# Patient Record
Sex: Female | Born: 1995 | Race: Black or African American | Hispanic: No | Marital: Single | Smoking: Former smoker
Health system: Southern US, Community
[De-identification: ages and names within clinical notes are randomized; demographics above are authoritative.]

## PROBLEM LIST (undated history)

## (undated) ENCOUNTER — Inpatient Hospital Stay: Payer: Self-pay

## (undated) ENCOUNTER — Inpatient Hospital Stay (HOSPITAL_COMMUNITY): Payer: 59 | Admitting: Family Medicine

## (undated) DIAGNOSIS — Z8759 Personal history of other complications of pregnancy, childbirth and the puerperium: Secondary | ICD-10-CM

## (undated) DIAGNOSIS — J020 Streptococcal pharyngitis: Secondary | ICD-10-CM

## (undated) DIAGNOSIS — J45909 Unspecified asthma, uncomplicated: Secondary | ICD-10-CM

## (undated) DIAGNOSIS — N760 Acute vaginitis: Secondary | ICD-10-CM

## (undated) DIAGNOSIS — B9689 Other specified bacterial agents as the cause of diseases classified elsewhere: Secondary | ICD-10-CM

## (undated) DIAGNOSIS — N390 Urinary tract infection, site not specified: Secondary | ICD-10-CM

## (undated) DIAGNOSIS — F419 Anxiety disorder, unspecified: Secondary | ICD-10-CM

## (undated) HISTORY — DX: Other specified bacterial agents as the cause of diseases classified elsewhere: N76.0

## (undated) HISTORY — DX: Anxiety disorder, unspecified: F41.9

## (undated) HISTORY — DX: Other specified bacterial agents as the cause of diseases classified elsewhere: B96.89

## (undated) HISTORY — DX: Urinary tract infection, site not specified: N39.0

## (undated) HISTORY — DX: Personal history of other complications of pregnancy, childbirth and the puerperium: Z87.59

---
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
## 2017-02-24 ENCOUNTER — Encounter: Payer: Self-pay | Admitting: Obstetrics and Gynecology

## 2017-02-24 ENCOUNTER — Ambulatory Visit (INDEPENDENT_AMBULATORY_CARE_PROVIDER_SITE_OTHER): Payer: 59 | Admitting: Obstetrics and Gynecology

## 2017-02-24 VITALS — BP 120/70 | Ht 61.0 in | Wt 120.0 lb

## 2017-02-24 DIAGNOSIS — N3000 Acute cystitis without hematuria: Secondary | ICD-10-CM

## 2017-02-24 LAB — POCT URINALYSIS DIPSTICK
Bilirubin, UA: NEGATIVE
Blood, UA: POSITIVE
Glucose, UA: NEGATIVE
Ketones, UA: NEGATIVE
NITRITE UA: NEGATIVE
PROTEIN UA: NEGATIVE
SPEC GRAV UA: 1.015 (ref 1.010–1.025)
UROBILINOGEN UA: 1 U/dL
pH, UA: 7 (ref 5.0–8.0)

## 2017-02-24 MED ORDER — CIPROFLOXACIN HCL 500 MG PO TABS: 500.0000 mg | ORAL_TABLET | Freq: Every day | ORAL | 0 refills | Status: AC

## 2017-02-24 MED ORDER — PHENAZOPYRIDINE HCL 200 MG PO TABS: 200.0000 mg | ORAL_TABLET | Freq: Three times a day (TID) | ORAL | 1 refills | Status: AC | PRN

## 2017-02-24 NOTE — Progress Notes (Signed)
Subjective:    Allison Sanchez is a 21 y.o. female who complains of burning with urination for 1 day.  Patient also complains of frequency and the sensation of still needing to void after urination.. Patient denies fever.  Patient does have a history of recurrent UTI.  Patient does not have a history of pyelonephritis. The following portions of the patient's history were reviewed and updated as appropriate: allergies, current medications, past medical history, past social history, past surgical history and problem list. Review of Systems A comprehensive review of systems was negative.    Objective:    BP 120/70   Ht 5\' 1"  (1.549 m)   Wt 120 lb (54.4 kg)   LMP 02/23/2017   BMI 22.67 kg/m  General: alert, cooperative and appears stated age  Abdomen: soft, non-tender, without masses or organomegaly    Back: straight  GU: defer exam   Laboratory:  Urine dipstick shows 1+ for hemoglobin and 2+ for leukocyte esterase.   Micro exam: pending.    Assessment:    Acute cystitis    Plan: Plan:    1. Medications: ciprofloxacin, pyridium for comfort. 2. Maintain adequate hydration 3. Discussed referral to urology if she is having recurrent UTIs. This is the patient's second visit this year for a urinary tract infection. Will send culture today.  4. Follow up if symptoms not improving, and prn. 5. Recommended an annual visit to the patient.

## 2017-02-26 LAB — URINE CULTURE

## 2017-03-14 ENCOUNTER — Encounter: Payer: Self-pay | Admitting: Maternal Newborn

## 2017-03-14 ENCOUNTER — Ambulatory Visit: Payer: 59 | Admitting: Maternal Newborn

## 2017-03-14 VITALS — BP 100/70 | HR 84 | Temp 99.0°F | Ht 61.0 in | Wt 121.0 lb

## 2017-03-14 DIAGNOSIS — R350 Frequency of micturition: Secondary | ICD-10-CM | POA: Diagnosis not present

## 2017-03-14 DIAGNOSIS — N760 Acute vaginitis: Secondary | ICD-10-CM | POA: Diagnosis not present

## 2017-03-14 DIAGNOSIS — R300 Dysuria: Secondary | ICD-10-CM | POA: Diagnosis not present

## 2017-03-14 LAB — POCT URINALYSIS DIPSTICK
Glucose, UA: NEGATIVE
Nitrite, UA: POSITIVE
SPEC GRAV UA: 1.02 (ref 1.010–1.025)
pH, UA: 6 (ref 5.0–8.0)

## 2017-03-14 LAB — POCT WET PREP (WET MOUNT)
Clue Cells Wet Prep Whiff POC: NEGATIVE
TRICHOMONAS WET PREP HPF POC: ABSENT

## 2017-03-14 MED ORDER — PHENAZOPYRIDINE HCL 200 MG PO TABS: 200.0000 mg | ORAL_TABLET | Freq: Three times a day (TID) | ORAL | 1 refills | Status: DC | PRN

## 2017-03-14 MED ORDER — FLUCONAZOLE 150 MG PO TABS
150.0000 mg | ORAL_TABLET | Freq: Every day | ORAL | 0 refills | Status: DC
Start: 2017-03-14 — End: 2017-04-05

## 2017-03-14 MED ORDER — CEPHALEXIN 500 MG PO CAPS
500.0000 mg | ORAL_CAPSULE | Freq: Two times a day (BID) | ORAL | 0 refills | Status: AC
Start: 2017-03-14 — End: 2017-03-21

## 2017-03-14 NOTE — Progress Notes (Signed)
Office Visit: Problem  Chief Complaint  Patient presents with  . Urinary Tract Infection    frequency, painful urination, more lower back pain/lower pelvis, feels like she still needs to go when she finishes, pain mostly on left side of back    Current Issues:  Presents with several days of dysuria and urinary frequency. She also feels as if she has incomplete voiding after she urinates. Associated symptoms include:  flank pain bilaterally and lower abdominal pain. She also noticed some yellow discharge. Afebrile, no chills.  There is a previous history of of similar symptoms. She was treated in July and in early November for UTI. Sexually active:  Yes with female.   No concern for STI.  Prior to Admission medications   Medication Sig Start Date End Date Taking? Authorizing Provider  albuterol (PROVENTIL HFA;VENTOLIN HFA) 108 (90 Base) MCG/ACT inhaler Inhale into the lungs.   Yes [provider]  norethindrone-ethinyl estradiol (JUNEL FE 1/20) 1-20 MG-MCG tablet Take 1 tablet by mouth daily. 07/09/16  Yes Copland, Ilona SorrelAlicia B, PA-C  cephALEXin (KEFLEX) 500 MG capsule Take 1 capsule (500 mg total) by mouth 2 (two) times daily for 7 days. 03/14/17 03/21/17  Oswaldo ConroySchmid, Jacelyn Y, CNM  fluconazole (DIFLUCAN) 150 MG tablet Take 1 tablet (150 mg total) by mouth daily. Take an additional dose if symptoms persist. 03/14/17   Oswaldo ConroySchmid, Jacelyn Y, CNM    Review of Systems: 10 point review of systems negative unless otherwise noted in HPI.  PE:  BP 100/70   Pulse 84   Temp 99 F (37.2 C)   Ht 5\' 1"  (1.549 m)   Wt 121 lb (54.9 kg)   LMP 02/23/2017   BMI 22.86 kg/m  Constitutional: NAD, alert, cooperative Back: CVAT Abdomen: soft, non-tender Pelvic: normal external genitalia, small amount of thick, yellow discharge  UA today shows 2+ leukocytes and positive for nitrites Wet prep: moderate yeast, no clue cells, negative whiff, ph<4, no trichomonads.  Assessment and Plan:    1. Dysuria  and urinary frequency Recently completed Cipro. Treat with Keflex for 7 days and repeat culture. Continue to stay well hydrated. May take Pyridium for pain.  - cephALEXin (KEFLEX) 500 MG capsule; Take 1 capsule (500 mg total) by mouth 2 (two) times daily for 7 days.  Dispense: 14 capsule; Refill: 0 - Urine culture  2. Vaginitis and vulvovaginitis Positive yeast on wet prep. NuSwab sent. Rx for Diflucan.  - fluconazole (DIFLUCAN) 150 MG tablet; Take 1 tablet (150 mg total) by mouth daily. Take an additional dose if symptoms persist.  Dispense: 2 tablet; Refill: 0  Return if symptoms worsen or fail to improve.   Marcelyn BruinsJacelyn Schmid, CNM 03/14/2017  12:00 PM

## 2017-03-16 LAB — URINE CULTURE

## 2017-03-17 LAB — NUSWAB BV AND CANDIDA, NAA
CANDIDA ALBICANS, NAA: POSITIVE — AB
CANDIDA GLABRATA, NAA: NEGATIVE

## 2017-03-18 ENCOUNTER — Other Ambulatory Visit: Payer: Self-pay | Admitting: Maternal Newborn

## 2017-03-18 DIAGNOSIS — R319 Hematuria, unspecified: Principal | ICD-10-CM

## 2017-03-18 DIAGNOSIS — A498 Other bacterial infections of unspecified site: Secondary | ICD-10-CM

## 2017-03-18 DIAGNOSIS — N390 Urinary tract infection, site not specified: Secondary | ICD-10-CM

## 2017-03-18 MED ORDER — SULFAMETHOXAZOLE-TRIMETHOPRIM 800-160 MG PO TABS: 1.0000 | ORAL_TABLET | Freq: Two times a day (BID) | ORAL | 0 refills | Status: AC

## 2017-03-18 NOTE — Progress Notes (Signed)
Left message for patient about change in antibiotics due to urine culture results and sent Rx for Bactrim to pharmacy.  Marcelyn BruinsJacelyn Schmid, CNM 03/18/2017  6:12 PM

## 2017-04-04 ENCOUNTER — Ambulatory Visit: Payer: Self-pay | Admitting: Unknown Physician Specialty

## 2017-04-05 ENCOUNTER — Ambulatory Visit (INDEPENDENT_AMBULATORY_CARE_PROVIDER_SITE_OTHER): Payer: 59 | Admitting: Unknown Physician Specialty

## 2017-04-05 ENCOUNTER — Encounter: Payer: Self-pay | Admitting: Unknown Physician Specialty

## 2017-04-05 VITALS — BP 111/74 | HR 87 | Temp 98.1°F | Ht 61.5 in | Wt 122.0 lb

## 2017-04-05 DIAGNOSIS — R0781 Pleurodynia: Secondary | ICD-10-CM | POA: Diagnosis not present

## 2017-04-05 NOTE — Patient Instructions (Signed)
Lidoderm patches

## 2017-04-05 NOTE — Progress Notes (Signed)
BP 111/74   Pulse 87   Temp 98.1 F (36.7 C) (Oral)   Ht 5' 1.5" (1.562 m)   Wt 122 lb (55.3 kg)   LMP 03/25/2017 (Approximate)   SpO2 98%   BMI 22.68 kg/m    Subjective:    Patient ID: Allison Sanchez, female    DOB: [birth year], 21 y.o.   MRN: [id number]  HPI: Allison Sanchez is a 21 y.o. female  Chief Complaint  Patient presents with  . Establish Care    pt states she has been having pain around left rib area. States she cannot lay on that side and doing certain movements hurts very bad. States she went to urgent care about a month ago for this problem. States the pain comes and goes.    New pt here with complaints of pain left rib area.  This has been going on for about 1 month.  Worse with movement or lying on that side or bending down.  States when the pain is "a 10" she has to try sleeping sitting up.  Pt states the pain will range from 0-10.  Pain waxes and wanes. No SOB.  Had a chest x-ray 1 month ago when she went to urgent care.  States the pain is mostly underneath her breast.    Relevant past medical, surgical, family and social history reviewed and updated as indicated. Interim medical history since our last visit reviewed. Allergies and medications reviewed and updated.  Social History   Socioeconomic History  . Marital status: Single    Spouse name: Not on file  . Number of children: Not on file  . Years of education: Not on file  . Highest education level: Not on file  Social Needs  . Financial resource strain: Not on file  . Food insecurity - worry: Not on file  . Food insecurity - inability: Not on file  . Transportation needs - medical: Not on file  . Transportation needs - non-medical: Not on file  Occupational History  . Not on file  Tobacco Use  . Smoking status: Current Every Day Smoker    Types: Cigarettes  . Smokeless tobacco: Never Used  . Tobacco comment: 1-2 cigarettes daily  Substance and Sexual Activity  . Alcohol use: No  . Drug use:  No  . Sexual activity: Yes    Birth control/protection: Pill  Other Topics Concern  . Not on file  Social History Narrative  . Not on file   Family History  Problem Relation Age of Onset  . Hypertension Mother   . Hypertension Daughter        pulmonary  . Cancer Paternal Uncle   . Gout Maternal Grandfather   . Cancer Paternal Uncle   . Cancer Paternal Uncle    Past Medical History:  Diagnosis Date  . Asthma   . History of twin pregnancy in prior pregnancy   . Premature delivery    Past Surgical History:  Procedure Laterality Date  . CESAREAN SECTION N/A 01/10/2016   Procedure: CESAREAN SECTION;  Surgeon: Catalina AntiguaPeggy Constant, MD;  Location: [facility];  Service: Obstetrics;  Laterality: N/A;     Review of Systems  Constitutional: Negative.   Respiratory: Negative.   Gastrointestinal: Negative.   Genitourinary: Negative.   Psychiatric/Behavioral: Negative.     Per HPI unless specifically indicated above     Objective:    BP 111/74   Pulse 87   Temp 98.1 F (36.7 C) (Oral)  Ht 5' 1.5" (1.562 m)   Wt 122 lb (55.3 kg)   LMP 03/25/2017 (Approximate)   SpO2 98%   BMI 22.68 kg/m   Wt Readings from Last 3 Encounters:  04/05/17 122 lb (55.3 kg) (38 %, Z= -0.30)*  03/14/17 121 lb (54.9 kg) (36 %, Z= -0.35)*  02/24/17 120 lb (54.4 kg) (34 %, Z= -0.40)*   * Growth percentiles are based on CDC (Girls, 2-20 Years) data.    Physical Exam  Constitutional: She is oriented to person, place, and time. She appears well-developed and well-nourished. No distress.  HENT:  Head: Normocephalic and atraumatic.  Eyes: Conjunctivae and lids are normal. Right eye exhibits no discharge. Left eye exhibits no discharge. No scleral icterus.  Neck: Normal range of motion. Neck supple. No JVD present. Carotid bruit is not present.  Cardiovascular: Normal rate, regular rhythm and normal heart sounds.  Pulmonary/Chest: Effort normal and breath sounds normal.  No rib TTP  Abdominal:  Normal appearance. There is no splenomegaly or hepatomegaly.  Musculoskeletal: Normal range of motion.  Neurological: She is alert and oriented to person, place, and time.  Skin: Skin is warm, dry and intact. No rash noted. No pallor.  Psychiatric: She has a normal mood and affect. Her behavior is normal. Judgment and thought content normal.    Results for orders placed or performed in visit on 03/14/17  Urine culture  Result Value Ref Range   Urine Culture, Routine Final report (A)    Organism ID, Bacteria Comment (A)   POCT urinalysis dipstick  Result Value Ref Range   Color, UA     Clarity, UA     Glucose, UA neg    Bilirubin, UA     Ketones, UA     Spec Grav, UA 1.020 1.010 - 1.025   Blood, UA large    pH, UA 6.0 5.0 - 8.0   Protein, UA     Urobilinogen, UA  0.2 or 1.0 E.U./dL   Nitrite, UA positive    Leukocytes, UA Moderate (2+) (A) Negative  POCT Wet Prep ([place]et [place])  Result Value Ref Range   Source Wet Prep POC     WBC, Wet Prep HPF POC     Bacteria Wet Prep HPF POC Few Few   BACTERIA WET PREP MORPHOLOGY POC     Clue Cells Wet Prep HPF POC None None   Clue Cells Wet Prep Whiff POC Negative Whiff    Yeast Wet Prep HPF POC Moderate    KOH Wet Prep POC     Trichomonas Wet Prep HPF POC Absent Absent  NuSwab BV and Candida, NAA  Result Value Ref Range   Atopobium vaginae Low - 0 Score   BVAB 2 Low - 0 Score   Megasphaera 1 Low - 0 Score   Candida albicans, NAA Positive (A) Negative   Candida glabrata, NAA Negative Negative      Assessment & Plan:   Problem List Items Addressed This Visit    None    Visit Diagnoses    Rib pain on left side    -  Primary   Unclear cause.  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  Get chest x-ray and rib films.  Consider bone scan.  Lidoderm patches for relief   Relevant Orders   DG Chest 2 View   DG Ribs Unilateral Left       Follow up plan: Return in about 4 weeks (around 05/03/2017).

## 2017-04-06 ENCOUNTER — Ambulatory Visit
Admission: RE | Admit: 2017-04-06 | Discharge: 2017-04-06 | Disposition: A | Discharge status: Home / Self Care | Payer: 59 | Source: Ambulatory Visit | Attending: Unknown Physician Specialty | Admitting: Unknown Physician Specialty

## 2017-04-06 DIAGNOSIS — R0781 Pleurodynia: Secondary | ICD-10-CM

## 2017-05-03 ENCOUNTER — Ambulatory Visit: Payer: Self-pay | Admitting: Unknown Physician Specialty

## 2017-05-04 ENCOUNTER — Ambulatory Visit: Payer: Managed Care, Other (non HMO) | Admitting: Family Medicine

## 2017-05-04 ENCOUNTER — Encounter: Payer: Self-pay | Admitting: Family Medicine

## 2017-05-04 ENCOUNTER — Ambulatory Visit: Payer: Self-pay | Admitting: Family Medicine

## 2017-05-04 VITALS — BP 116/70 | HR 69 | Temp 98.7°F | Wt 124.0 lb

## 2017-05-04 DIAGNOSIS — J019 Acute sinusitis, unspecified: Secondary | ICD-10-CM

## 2017-05-04 MED ORDER — BENZONATATE 100 MG PO CAPS
100.0000 mg | ORAL_CAPSULE | Freq: Two times a day (BID) | ORAL | 0 refills | Status: DC | PRN
Start: 2017-05-04 — End: 2017-09-28

## 2017-05-04 MED ORDER — AMOXICILLIN-POT CLAVULANATE 875-125 MG PO TABS: 1.0000 | ORAL_TABLET | Freq: Two times a day (BID) | ORAL | 0 refills | Status: DC

## 2017-05-04 NOTE — Progress Notes (Signed)
BP 116/70   Pulse 69   Temp 98.7 F (37.1 C) (Oral)   Wt 124 lb (56.2 kg)   SpO2 98%   BMI 23.05 kg/m    Subjective:    Patient ID: Allison Sanchez, female    DOB: [birth year], 22 y.o.   MRN: [id number]  HPI: Allison Sanchez is a 22 y.o. female  Chief Complaint  Patient presents with  . Cough    x 3 weeks   Patient with cough congestion drainagefacial pressure  Some systemic symptoms of fever and achiness is been ongoing for 3 weeks and now getting worse.Hasn't really tried other medications.  Relevant past medical, surgical, family and social history reviewed and updated as indicated. Interim medical history since our last visit reviewed. Allergies and medications reviewed and updated.  Review of Systems  Constitutional: Positive for chills, diaphoresis, fatigue and fever.  HENT: Positive for congestion, sinus pressure, sinus pain, sneezing and sore throat.   Respiratory: Positive for cough. Negative for wheezing.   Cardiovascular: Negative.  Negative for chest pain.    Per HPI unless specifically indicated above     Objective:    BP 116/70   Pulse 69   Temp 98.7 F (37.1 C) (Oral)   Wt 124 lb (56.2 kg)   SpO2 98%   BMI 23.05 kg/m   Wt Readings from Last 3 Encounters:  05/04/17 124 lb (56.2 kg) (42 %, Z= -0.20)*  04/05/17 122 lb (55.3 kg) (38 %, Z= -0.30)*  03/14/17 121 lb (54.9 kg) (36 %, Z= -0.35)*   * Growth percentiles are based on CDC (Girls, 2-20 Years) data.    Physical Exam  Constitutional: She is oriented to person, place, and time. She appears well-developed and well-nourished.  HENT:  Head: Normocephalic and atraumatic.  Right Ear: External ear normal.  Left Ear: External ear normal.  Mouth/Throat: Oropharyngeal exudate present.  Eyes: Conjunctivae and EOM are normal.  Neck: Normal range of motion.  Cardiovascular: Normal rate, regular rhythm and normal heart sounds.  Pulmonary/Chest: Effort normal and breath sounds normal.    Musculoskeletal: Normal range of motion.  Neurological: She is alert and oriented to person, place, and time.  Skin: No erythema.  Psychiatric: She has a normal mood and affect. Her behavior is normal. Judgment and thought content normal.    Results for orders placed or performed in visit on 03/14/17  Urine culture  Result Value Ref Range   Urine Culture, Routine Final report (A)    Organism ID, Bacteria Comment (A)   POCT urinalysis dipstick  Result Value Ref Range   Color, UA     Clarity, UA     Glucose, UA neg    Bilirubin, UA     Ketones, UA     Spec Grav, UA 1.020 1.010 - 1.025   Blood, UA large    pH, UA 6.0 5.0 - 8.0   Protein, UA     Urobilinogen, UA  0.2 or 1.0 E.U./dL   Nitrite, UA positive    Leukocytes, UA Moderate (2+) (A) Negative  POCT Wet Prep (Wet [place])  Result Value Ref Range   Source Wet Prep POC     WBC, Wet Prep HPF POC     Bacteria Wet Prep HPF POC Few Few   BACTERIA WET PREP MORPHOLOGY POC     Clue Cells Wet Prep HPF POC None None   Clue Cells Wet Prep Whiff POC Negative Whiff    Yeast Wet Prep HPF  POC Moderate    KOH Wet Prep POC     Trichomonas Wet Prep HPF POC Absent Absent  NuSwab BV and Candida, NAA  Result Value Ref Range   Atopobium vaginae Low - 0 Score   BVAB 2 Low - 0 Score   Megasphaera 1 Low - 0 Score   Candida albicans, NAA Positive (A) Negative   Candida glabrata, NAA Negative Negative      Assessment & Plan:   Problem List Items Addressed This Visit    None    Visit Diagnoses    Acute sinusitis, recurrence not specified, unspecified location    -  Primary   Relevant Medications   amoxicillin-clavulanate (AUGMENTIN) 875-125 MG tablet   benzonatate (TESSALON) 100 MG capsule     discussed sinusitis care and treatment use of medications use of over-the-counter medications  Follow up plan: Return for As scheduled.

## 2017-05-08 ENCOUNTER — Encounter: Payer: Self-pay | Admitting: Unknown Physician Specialty

## 2017-05-08 ENCOUNTER — Ambulatory Visit (INDEPENDENT_AMBULATORY_CARE_PROVIDER_SITE_OTHER): Payer: Managed Care, Other (non HMO) | Admitting: Unknown Physician Specialty

## 2017-05-08 VITALS — BP 111/70 | HR 74 | Temp 97.9°F | Ht 61.5 in | Wt 122.7 lb

## 2017-05-08 DIAGNOSIS — Z3041 Encounter for surveillance of contraceptive pills: Secondary | ICD-10-CM

## 2017-05-08 DIAGNOSIS — J320 Chronic maxillary sinusitis: Secondary | ICD-10-CM | POA: Diagnosis not present

## 2017-05-08 DIAGNOSIS — Z0000 Encounter for general adult medical examination without abnormal findings: Secondary | ICD-10-CM

## 2017-05-08 DIAGNOSIS — R0789 Other chest pain: Secondary | ICD-10-CM

## 2017-05-08 MED ORDER — ALBUTEROL SULFATE HFA 108 (90 BASE) MCG/ACT IN AERS: 2.0000 | INHALATION_SPRAY | Freq: Four times a day (QID) | RESPIRATORY_TRACT | 2 refills | Status: DC | PRN

## 2017-05-08 MED ORDER — AMOXICILLIN 250 MG/5ML PO SUSR: 500.0000 mg | Freq: Three times a day (TID) | ORAL | 0 refills | Status: DC

## 2017-05-08 MED ORDER — NORETHIN ACE-ETH ESTRAD-FE 1-20 MG-MCG PO TABS: 1.0000 | ORAL_TABLET | Freq: Every day | ORAL | 11 refills | Status: DC

## 2017-05-08 NOTE — Assessment & Plan Note (Signed)
This is occasional but severe when it happens.  X-rays are negative.  Will start NSAIDs to see if it helps with the pain.  May need a thorough pulmonary evaluation if it continues

## 2017-05-08 NOTE — Progress Notes (Signed)
BP 111/70   Pulse 74   Temp 97.9 F (36.6 C) (Oral)   Ht 5' 1.5" (1.562 m)   Wt 122 lb 11.2 oz (55.7 kg)   SpO2 98%   BMI 22.81 kg/m    Subjective:    Patient ID: Allison Sanchez, female    DOB: 1996/04/17, 22 y.o.   MRN: [id number]  HPI: Allison Sanchez is a 22 y.o. female  Chief Complaint  Patient presents with  . Annual Exam   Chest wall pain Pt states her pain comes and goes.  Her x-rays have all been normal.  Left sided pain comes and goes.  Episodes are getting more frequent.  No pain today.  Pt states it happens once every few months for 3-4 days.  States it feels like someone is sitting on her ribs.    Rib films and chest x-ray reviewed and were normal.    Sinusitis  This is a new (rx with Dr Dossie Arbour.  She can't take pills) problem. There has been no fever. Associated symptoms include congestion, coughing, headaches, a hoarse voice and sinus pressure. Pertinent negatives include no chills, diaphoresis, ear pain, neck pain, shortness of breath, sneezing, sore throat or swollen glands. Past treatments include nothing.   Asthma Needs inhaler on occasion.  Needs a refill  Relevant past medical, surgical, family and social history reviewed and updated as indicated. Interim medical history since our last visit reviewed. Allergies and medications reviewed and updated.  Review of Systems  Constitutional: Negative for chills and diaphoresis.  HENT: Positive for congestion, hoarse voice and sinus pressure. Negative for ear pain, sneezing and sore throat.   Respiratory: Positive for cough. Negative for shortness of breath.   Musculoskeletal: Negative for neck pain.  Neurological: Positive for headaches.    Per HPI unless specifically indicated above     Objective:    BP 111/70   Pulse 74   Temp 97.9 F (36.6 C) (Oral)   Ht 5' 1.5" (1.562 m)   Wt 122 lb 11.2 oz (55.7 kg)   SpO2 98%   BMI 22.81 kg/m   Wt Readings from Last 3 Encounters:  05/08/17 122 lb 11.2 oz  (55.7 kg) (39 %, Z= -0.27)*  05/04/17 124 lb (56.2 kg) (42 %, Z= -0.20)*  04/05/17 122 lb (55.3 kg) (38 %, Z= -0.30)*   * Growth percentiles are based on CDC (Girls, 2-20 Years) data.    Physical Exam  Constitutional: She is oriented to person, place, and time. She appears well-developed and well-nourished. No distress.  HENT:  Head: Normocephalic and atraumatic.  Right Ear: Tympanic membrane and ear canal normal.  Left Ear: Tympanic membrane and ear canal normal.  Nose: Rhinorrhea present. Right sinus exhibits no maxillary sinus tenderness and no frontal sinus tenderness. Left sinus exhibits no maxillary sinus tenderness and no frontal sinus tenderness.  Mouth/Throat: Mucous membranes are normal. Posterior oropharyngeal erythema present.  Eyes: Conjunctivae and lids are normal. Pupils are equal, round, and reactive to light. Right eye exhibits no discharge. Left eye exhibits no discharge. No scleral icterus.  Neck: Normal range of motion. Neck supple. Carotid bruit is not present. No thyromegaly present.  Cardiovascular: Normal rate, regular rhythm and normal heart sounds. Exam reveals no gallop and no friction rub.  No murmur heard. Pulmonary/Chest: Effort normal and breath sounds normal. No respiratory distress. She has no wheezes. She has no rales.  Abdominal: Soft. Normal appearance and bowel sounds are normal. There is no splenomegaly or  hepatomegaly. There is no tenderness. There is no rebound.  Genitourinary: No breast swelling, tenderness or discharge.  Musculoskeletal: Normal range of motion.  Lymphadenopathy:    She has no cervical adenopathy.  Neurological: She is alert and oriented to person, place, and time.  Skin: Skin is warm, dry and intact. No rash noted. No pallor.  Psychiatric: She has a normal mood and affect. Her speech is normal and behavior is normal. Judgment and thought content normal. Cognition and memory are normal.    Results for orders placed or performed in  visit on 03/14/17  Urine culture  Result Value Ref Range   Urine Culture, Routine Final report (A)    Organism ID, Bacteria Comment (A)   POCT urinalysis dipstick  Result Value Ref Range   Color, UA     Clarity, UA     Glucose, UA neg    Bilirubin, UA     Ketones, UA     Spec Grav, UA 1.020 1.010 - 1.025   Blood, UA large    pH, UA 6.0 5.0 - 8.0   Protein, UA     Urobilinogen, UA  0.2 or 1.0 E.U./dL   Nitrite, UA positive    Leukocytes, UA Moderate (2+) (A) Negative  POCT Wet Prep (Wet [place])  Result Value Ref Range   Source Wet Prep POC     WBC, Wet Prep HPF POC     Bacteria Wet Prep HPF POC Few Few   BACTERIA WET PREP MORPHOLOGY POC     Clue Cells Wet Prep HPF POC None None   Clue Cells Wet Prep Whiff POC Negative Whiff    Yeast Wet Prep HPF POC Moderate    KOH Wet Prep POC     Trichomonas Wet Prep HPF POC Absent Absent  NuSwab BV and Candida, NAA  Result Value Ref Range   Atopobium vaginae Low - 0 Score   BVAB 2 Low - 0 Score   Megasphaera 1 Low - 0 Score   Candida albicans, NAA Positive (A) Negative   Candida glabrata, NAA Negative Negative      Assessment & Plan:   Problem List Items Addressed This Visit      Unprioritized   Left-sided chest wall pain    This is occasional but severe when it happens.  X-rays are negative.  Will start NSAIDs to see if it helps with the pain.  May need a thorough pulmonary evaluation if it continues       Other Visit Diagnoses    Annual physical exam    -  Primary   Encounter for surveillance of contraceptive pills       Relevant Medications   norethindrone-ethinyl estradiol (JUNEL FE 1/20) 1-20 MG-MCG tablet   Chronic maxillary sinusitis       Relevant Medications   amoxicillin (AMOXIL) 250 MG/5ML suspension   Encounter for surveillance of contraceptive pills       Doing well with junel. Pt has RF. Neg UPT. Restart OCPs Sun. Condoms for 1 mo. F/u prn.   Relevant Medications   norethindrone-ethinyl estradiol (JUNEL FE  1/20) 1-20 MG-MCG tablet       Follow up plan: No Follow-up on file.

## 2017-05-17 ENCOUNTER — Emergency Department
Admission: EM | Admit: 2017-05-17 | Discharge: 2017-05-18 | Disposition: A | Discharge status: Home / Self Care | Payer: Managed Care, Other (non HMO) | Attending: Emergency Medicine | Admitting: Emergency Medicine

## 2017-05-17 ENCOUNTER — Other Ambulatory Visit: Payer: Self-pay

## 2017-05-17 DIAGNOSIS — F17210 Nicotine dependence, cigarettes, uncomplicated: Secondary | ICD-10-CM | POA: Insufficient documentation

## 2017-05-17 DIAGNOSIS — E860 Dehydration: Secondary | ICD-10-CM | POA: Diagnosis not present

## 2017-05-17 DIAGNOSIS — Z79899 Other long term (current) drug therapy: Secondary | ICD-10-CM | POA: Insufficient documentation

## 2017-05-17 DIAGNOSIS — R05 Cough: Secondary | ICD-10-CM | POA: Diagnosis not present

## 2017-05-17 DIAGNOSIS — R112 Nausea with vomiting, unspecified: Secondary | ICD-10-CM | POA: Diagnosis not present

## 2017-05-17 DIAGNOSIS — J45909 Unspecified asthma, uncomplicated: Secondary | ICD-10-CM | POA: Diagnosis not present

## 2017-05-17 DIAGNOSIS — R197 Diarrhea, unspecified: Secondary | ICD-10-CM | POA: Insufficient documentation

## 2017-05-17 DIAGNOSIS — R6889 Other general symptoms and signs: Secondary | ICD-10-CM

## 2017-05-17 LAB — COMPREHENSIVE METABOLIC PANEL
ALK PHOS: 95 U/L (ref 38–126)
ALT: 13 U/L — AB (ref 14–54)
AST: 27 U/L (ref 15–41)
Albumin: 4.3 g/dL (ref 3.5–5.0)
Anion gap: 10 (ref 5–15)
BUN: 14 mg/dL (ref 6–20)
CHLORIDE: 104 mmol/L (ref 101–111)
CO2: 24 mmol/L (ref 22–32)
CREATININE: 0.73 mg/dL (ref 0.44–1.00)
Calcium: 9.3 mg/dL (ref 8.9–10.3)
Glucose, Bld: 123 mg/dL — ABNORMAL HIGH (ref 65–99)
Potassium: 3.6 mmol/L (ref 3.5–5.1)
Sodium: 138 mmol/L (ref 135–145)
Total Bilirubin: 1 mg/dL (ref 0.3–1.2)
Total Protein: 8.2 g/dL — ABNORMAL HIGH (ref 6.5–8.1)

## 2017-05-17 LAB — CBC WITH DIFFERENTIAL/PLATELET
BASOS ABS: 0 10*3/uL (ref 0–0.1)
Basophils Relative: 0 %
EOS PCT: 0 %
Eosinophils Absolute: 0 10*3/uL (ref 0–0.7)
HCT: 43.8 % (ref 35.0–47.0)
Hemoglobin: 15.1 g/dL (ref 12.0–16.0)
LYMPHS ABS: 0.2 10*3/uL — AB (ref 1.0–3.6)
LYMPHS PCT: 2 %
MCH: 30.8 pg (ref 26.0–34.0)
MCHC: 34.4 g/dL (ref 32.0–36.0)
MCV: 89.5 fL (ref 80.0–100.0)
Monocytes Absolute: 0.7 10*3/uL (ref 0.2–0.9)
Monocytes Relative: 5 %
NEUTROS ABS: 14.8 10*3/uL — AB (ref 1.4–6.5)
Neutrophils Relative %: 93 %
PLATELETS: 221 10*3/uL (ref 150–440)
RBC: 4.9 MIL/uL (ref 3.80–5.20)
RDW: 12.2 % (ref 11.5–14.5)
WBC: 15.8 10*3/uL — AB (ref 3.6–11.0)

## 2017-05-17 LAB — LIPASE, BLOOD: Lipase: 24 U/L (ref 11–51)

## 2017-05-17 MED ORDER — ACETAMINOPHEN 325 MG PO TABS
650.0000 mg | ORAL_TABLET | Freq: Once | ORAL | Status: AC
  Administered 2017-05-17: 650 mg via ORAL
  Filled 2017-05-17: qty 2

## 2017-05-17 MED ORDER — SODIUM CHLORIDE 0.9 % IV BOLUS (SEPSIS)
1000.0000 mL | Freq: Once | INTRAVENOUS | Status: AC
  Administered 2017-05-18: 1000 mL via INTRAVENOUS

## 2017-05-17 MED ORDER — ONDANSETRON HCL 4 MG/2ML IJ SOLN
4.0000 mg | Freq: Once | INTRAMUSCULAR | Status: AC
  Administered 2017-05-18: 4 mg via INTRAVENOUS
  Filled 2017-05-17: qty 2

## 2017-05-17 NOTE — ED Triage Notes (Addendum)
Pt c/o vomiting and diarrhea - she has had 1 loose stool in 24 hours - vomited x3 in 24 hours - c/o weakness and body aches

## 2017-05-17 NOTE — ED Notes (Signed)
Pt uprite on stretcher in exam room with no distress noted; pt reports V x 3 and D x 1 today; denies abd pain although she st this morning she had a sharp pain to lower abd; denies urinary c/o; st recent cold symptoms which have resolved; abd soft/nondist/nontender, +BS; SO at bedside

## 2017-05-17 NOTE — ED Provider Notes (Signed)
[facility] Emergency Department Provider Note   ____________________________________________   First MD Initiated Contact with Patient 05/17/17 2348     (approximate)  I have reviewed the triage vital signs and the nursing notes.   HISTORY  Chief Complaint Emesis and Diarrhea    HPI Allison Sanchez is a 22 y.o. female who presents to the ED from home with a chief complaint of cold-like symptoms, nausea, vomiting and diarrhea.  Reports dry cough and congestion for the past few days which have gotten better.  It appears that her morning she had a sharp pain to her lower abdomen.  Pain resolved and subsequently during the day patient had 3 episodes of vomiting and one episode of diarrhea.  Currently just feels generally weak with body aches.  Denies associated chest pain, shortness of breath, dysuria, headache, dizziness.  Denies recent travel, trauma or recent antibiotic use.   Past Medical History:  Diagnosis Date  . Asthma   . History of twin pregnancy in prior pregnancy   . Premature delivery     Patient Active Problem List   Diagnosis Date Noted  . Left-sided chest wall pain 05/08/2017  . Gastroesophageal reflux disease with esophagitis 02/26/2014    Past Surgical History:  Procedure Laterality Date  . CESAREAN SECTION N/A 01/10/2016   Procedure: CESAREAN SECTION;  Surgeon: Catalina Antigua, MD;  Location: [facility];  Service: Obstetrics;  Laterality: N/A;    Prior to Admission medications   Medication Sig Start Date End Date Taking? Authorizing Provider  albuterol (PROVENTIL HFA;VENTOLIN HFA) 108 (90 Base) MCG/ACT inhaler Inhale 2 puffs into the lungs every 6 (six) hours as needed. 05/08/17   Gabriel Cirri, NP  amoxicillin (AMOXIL) 250 MG/5ML suspension Take 10 mLs (500 mg total) by mouth 3 (three) times daily. 05/08/17   Gabriel Cirri, NP  benzonatate (TESSALON) 100 MG capsule Take 1 capsule (100 mg total) by mouth 2 (two) times daily  as needed for cough. 05/04/17   Steele Sizer, MD  norethindrone-ethinyl estradiol (JUNEL FE 1/20) 1-20 MG-MCG tablet Take 1 tablet by mouth daily. 05/08/17   Gabriel Cirri, NP  ondansetron (ZOFRAN ODT) 4 MG disintegrating tablet Take 1 tablet (4 mg total) by mouth every 8 (eight) hours as needed for nausea or vomiting. 05/18/17   Irean Hong, MD    Allergies Shellfish allergy  Family History  Problem Relation Age of Onset  . Hypertension Mother   . Hypertension Daughter        pulmonary  . Cancer Paternal Uncle   . Gout Maternal Grandfather   . Cancer Paternal Uncle   . Cancer Paternal Uncle     Social History Social History   Tobacco Use  . Smoking status: Current Every Day Smoker    Packs/day: 0.50    Types: Cigarettes  . Smokeless tobacco: Never Used  . Tobacco comment: 1-2 cigarettes daily  Substance Use Topics  . Alcohol use: No  . Drug use: No    Review of Systems  Constitutional: Positive for fever/chills. Eyes: No visual changes. ENT: No sore throat. Cardiovascular: Denies chest pain. Respiratory: Positive for nonproductive cough.  Denies shortness of breath. Gastrointestinal: Positive for abdominal pain, nausea, vomiting and diarrhea.  No constipation. Genitourinary: Negative for dysuria. Musculoskeletal: Negative for back pain. Skin: Negative for rash. Neurological: Negative for headaches, focal weakness or numbness.   ____________________________________________   PHYSICAL EXAM:  VITAL SIGNS: ED Triage Vitals  Enc Vitals Group  BP 05/17/17 2035 113/76     Pulse Rate 05/17/17 2035 (!) 130     Resp 05/17/17 2035 16     Temp 05/17/17 2035 99.2 F (37.3 C)     Temp Source 05/17/17 2035 Oral     SpO2 05/17/17 2035 100 %     Weight 05/17/17 2034 123 lb (55.8 kg)     Height 05/17/17 2034 5\' 1"  (1.549 m)     Head Circumference --      Peak Flow --      Pain Score 05/17/17 2034 5     Pain Loc --      Pain Edu? --      Excl. in GC? --      Constitutional: Alert and oriented. Well appearing and in no acute distress. Eyes: Conjunctivae are normal. PERRL. EOMI. Head: Atraumatic. Nose: Congestion/rhinnorhea. Mouth/Throat: Mucous membranes are moist.  Oropharynx mildly erythematous without tonsillar swelling, exudates or peritonsillar abscess.  There is no hoarse or muffled voice.  There is no drooling. Neck: No stridor.  Supple neck without meningismus. Hematological/Lymphatic/Immunilogical: No cervical lymphadenopathy. Cardiovascular: Normal rate, regular rhythm. Grossly normal heart sounds.  Good peripheral circulation. Respiratory: Normal respiratory effort.  No retractions. Lungs CTAB. Gastrointestinal: Soft and nontender to light or deep palpation. No distention. No abdominal bruits. No CVA tenderness. Musculoskeletal: No lower extremity tenderness nor edema.  No joint effusions. Neurologic:  Normal speech and language. No gross focal neurologic deficits are appreciated.  Skin:  Skin is warm, dry and intact. No rash noted.  No petechiae. Psychiatric: Mood and affect are normal. Speech and behavior are normal.  ____________________________________________   LABS (all labs ordered are listed, but only abnormal results are displayed)  Labs Reviewed  CBC WITH DIFFERENTIAL/PLATELET - Abnormal; Notable for the following components:      Result Value   WBC 15.8 (*)    Neutro Abs 14.8 (*)    Lymphs Abs 0.2 (*)    All other components within normal limits  COMPREHENSIVE METABOLIC PANEL - Abnormal; Notable for the following components:   Glucose, Bld 123 (*)    Total Protein 8.2 (*)    ALT 13 (*)    All other components within normal limits  URINALYSIS, COMPLETE (UACMP) WITH MICROSCOPIC - Abnormal; Notable for the following components:   Color, Urine YELLOW (*)    APPearance HAZY (*)    Ketones, ur 20 (*)    Bacteria, UA RARE (*)    Squamous Epithelial / LPF 0-5 (*)    All other components within normal limits   INFLUENZA PANEL BY PCR (TYPE A & B)  LIPASE, BLOOD  MONONUCLEOSIS SCREEN  POC URINE PREG, ED  POCT PREGNANCY, URINE   ____________________________________________  EKG  None ____________________________________________  RADIOLOGY  Dg Chest 2 View  Result Date: 05/18/2017 CLINICAL DATA:  Vomiting and diarrhea.  Weakness and body aches. EXAM: CHEST  2 VIEW COMPARISON:  04/06/2017 FINDINGS: The heart size and mediastinal contours are within normal limits. Both lungs are clear. The visualized skeletal structures are unremarkable. IMPRESSION: No active cardiopulmonary disease. Electronically Signed   By: Burman NievesWilliam  Stevens M.D.   On: 05/18/2017 01:26    ____________________________________________   PROCEDURES  Procedure(s) performed: None  Procedures  Critical Care performed: No  ____________________________________________   INITIAL IMPRESSION / ASSESSMENT AND PLAN / ED COURSE  As part of my medical decision making, I reviewed the following data within the electronic MEDICAL RECORD NUMBER Nursing notes reviewed and incorporated, Labs reviewed, Radiograph  reviewed and Notes from prior ED visits.   22 year old female who presents with cold-like symptoms, myalgias, nausea/vomiting/diarrhea. Differential includes, but is not limited to, viral syndrome, bronchitis including COPD exacerbation, pneumonia, reactive airway disease including asthma, CHF including exacerbation with or without pulmonary/interstitial edema, pneumothorax, ACS, thoracic trauma, and pulmonary embolism.  Patient was given Tylenol in triage for low-grade fever.  IV fluids initiated.  She was given Zofran.  Currently resting in no acute distress.  Laboratory results thus far notable for moderate leukocytosis.  Influenza is negative.  Awaiting results of urinalysis, x-ray and mononucleosis screen.  Clinical Course as of May 18 525  Thu May 18, 2017  0303 Patient resting no acute distress.  Tolerated ice chips  without emesis.  Pulse rate normalized.  Updated her on all test results.  Will discharge home with Zofran to use as needed, and she will follow-up with her PCP this week.  Strict return precautions given.  Patient verbalizes understanding and agrees with plan of care.  [JS]    Clinical Course User Index [JS] Irean Hong, MD     ____________________________________________   FINAL CLINICAL IMPRESSION(S) / ED DIAGNOSES  Final diagnoses:  Flu-like symptoms  Dehydration  Nausea vomiting and diarrhea     ED Discharge Orders        Ordered    ondansetron (ZOFRAN ODT) 4 MG disintegrating tablet  Every 8 hours PRN     05/18/17 0305       Note:  This document was prepared using Dragon voice recognition software and may include unintentional dictation errors.    Irean Hong, MD 05/18/17 [phone]

## 2017-05-18 ENCOUNTER — Emergency Department: Payer: Managed Care, Other (non HMO)

## 2017-05-18 LAB — URINALYSIS, COMPLETE (UACMP) WITH MICROSCOPIC
Bilirubin Urine: NEGATIVE
GLUCOSE, UA: NEGATIVE mg/dL
HGB URINE DIPSTICK: NEGATIVE
Ketones, ur: 20 mg/dL — AB
Leukocytes, UA: NEGATIVE
NITRITE: NEGATIVE
PH: 5 (ref 5.0–8.0)
Protein, ur: NEGATIVE mg/dL
SPECIFIC GRAVITY, URINE: 1.02 (ref 1.005–1.030)

## 2017-05-18 LAB — INFLUENZA PANEL BY PCR (TYPE A & B)
Influenza A By PCR: NEGATIVE
Influenza B By PCR: NEGATIVE

## 2017-05-18 LAB — MONONUCLEOSIS SCREEN: MONO SCREEN: NEGATIVE

## 2017-05-18 LAB — POCT PREGNANCY, URINE: Preg Test, Ur: NEGATIVE

## 2017-05-18 MED ORDER — ONDANSETRON 4 MG PO TBDP: 4.0000 mg | ORAL_TABLET | Freq: Three times a day (TID) | ORAL | 0 refills | Status: DC | PRN

## 2017-05-18 NOTE — ED Notes (Signed)
Pt to xray via stretcher accomp by xray tech 

## 2017-05-18 NOTE — Discharge Instructions (Signed)
1.  You may take Zofran as needed for nausea. 2.  Clear liquids for 12 hours, then BRAT diet for 3 days, then slowly advance diet as tolerated. 3.  Return to the ER for worsening symptoms, persistent vomiting, difficulty breathing or other concerns.

## 2017-07-11 ENCOUNTER — Ambulatory Visit: Payer: Managed Care, Other (non HMO) | Admitting: Obstetrics and Gynecology

## 2017-07-11 ENCOUNTER — Encounter: Payer: Self-pay | Admitting: Obstetrics and Gynecology

## 2017-07-11 VITALS — BP 100/60 | HR 71 | Ht 61.0 in | Wt 123.0 lb

## 2017-07-11 DIAGNOSIS — B373 Candidiasis of vulva and vagina: Secondary | ICD-10-CM

## 2017-07-11 DIAGNOSIS — N3001 Acute cystitis with hematuria: Secondary | ICD-10-CM | POA: Diagnosis not present

## 2017-07-11 DIAGNOSIS — B3731 Acute candidiasis of vulva and vagina: Secondary | ICD-10-CM

## 2017-07-11 LAB — POCT WET PREP WITH KOH
Clue Cells Wet Prep HPF POC: NEGATIVE
KOH Prep POC: NEGATIVE
TRICHOMONAS UA: NEGATIVE

## 2017-07-11 LAB — POCT URINALYSIS DIPSTICK
Bilirubin, UA: NEGATIVE
Glucose, UA: NEGATIVE
Ketones, UA: NEGATIVE
NITRITE UA: NEGATIVE
Protein, UA: NEGATIVE
SPEC GRAV UA: 1.025 (ref 1.010–1.025)
pH, UA: 5 (ref 5.0–8.0)

## 2017-07-11 MED ORDER — NITROFURANTOIN MONOHYD MACRO 100 MG PO CAPS: 100.0000 mg | ORAL_CAPSULE | Freq: Two times a day (BID) | ORAL | 0 refills | Status: AC

## 2017-07-11 MED ORDER — FLUCONAZOLE 150 MG PO TABS: 150.0000 mg | ORAL_TABLET | Freq: Once | ORAL | 0 refills | Status: AC

## 2017-07-11 NOTE — Patient Instructions (Signed)
I value your feedback and entrusting us with your care. If you get a [facility] patient survey, I would appreciate you taking the time to let us know about your experience today. Thank you! 

## 2017-07-11 NOTE — Progress Notes (Signed)
Gabriel Cirri, NP   Chief Complaint  Patient presents with  . Urinary Tract Infection  . Vaginitis    HPI:      [name] is a 22 y.o. [phone] who LMP was Patient's last menstrual period was 06/29/2017., presents today for UTI sx of urinary frequency/urgency/dysuria for he past day. No hematuria, LBP, belly pain, fevers. Pt with about 4-5 UTIs a yr, sometimes triggered by sex, but already had 2 since 11/18 and rarely sex active now. Pt also doesn't always go to MD for sx. Never seen urology and wants to. Drinks sodas/tea/caffeine.   C&S results:  GBS 10/17 klebsiella 2/18  E. Coli 7/18 Enterobacter 03/14/17  Also has increased vag d/c, no odor or itch. Sx recently. No meds to treat. Hx of BV in the past.    Past Medical History:  Diagnosis Date  . Asthma   . BV (bacterial vaginosis)   . History of twin pregnancy in prior pregnancy   . Premature delivery   . UTI (urinary tract infection)     Past Surgical History:  Procedure Laterality Date  . CESAREAN SECTION N/A 01/10/2016   Procedure: CESAREAN SECTION;  Surgeon: Catalina Antigua, MD;  Location: [facility];  Service: Obstetrics;  Laterality: N/A;    Family History  Problem Relation Age of Onset  . Hypertension Mother   . Hypertension Daughter        pulmonary  . Cancer Paternal Uncle   . Gout Maternal Grandfather   . Cancer Paternal Uncle   . Cancer Paternal Uncle     Social History   Socioeconomic History  . Marital status: Single    Spouse name: Not on file  . Number of children: Not on file  . Years of education: Not on file  . Highest education level: Not on file  Social Needs  . Financial resource strain: Not on file  . Food insecurity - worry: Not on file  . Food insecurity - inability: Not on file  . Transportation needs - medical: Not on file  . Transportation needs - non-medical: Not on file  Occupational History  . Not on file  Tobacco Use  . Smoking status: Current  Every Day Smoker    Packs/day: 0.50    Types: Cigarettes  . Smokeless tobacco: Never Used  . Tobacco comment: 1-2 cigarettes daily  Substance and Sexual Activity  . Alcohol use: No  . Drug use: No  . Sexual activity: Yes    Birth control/protection: Pill  Other Topics Concern  . Not on file  Social History Narrative  . Not on file    Outpatient Medications Prior to Visit  Medication Sig Dispense Refill  . albuterol (PROVENTIL HFA;VENTOLIN HFA) 108 (90 Base) MCG/ACT inhaler Inhale 2 puffs into the lungs every 6 (six) hours as needed. 1 Inhaler 2  . norethindrone-ethinyl estradiol (JUNEL FE 1/20) 1-20 MG-MCG tablet Take 1 tablet by mouth daily. 1 Package 11  . amoxicillin (AMOXIL) 250 MG/5ML suspension Take 10 mLs (500 mg total) by mouth 3 (three) times daily. (Patient not taking: Reported on 07/11/2017) 150 mL 0  . benzonatate (TESSALON) 100 MG capsule Take 1 capsule (100 mg total) by mouth 2 (two) times daily as needed for cough. (Patient not taking: Reported on 07/11/2017) 20 capsule 0  . ondansetron (ZOFRAN ODT) 4 MG disintegrating tablet Take 1 tablet (4 mg total) by mouth every 8 (eight) hours as needed for nausea or vomiting. (Patient not taking:  Reported on 07/11/2017) 20 tablet 0   No facility-administered medications prior to visit.     ROS:  Review of Systems  Constitutional: Negative for fever.  Gastrointestinal: Negative for blood in stool, constipation, diarrhea, nausea and vomiting.  Genitourinary: Positive for dysuria, frequency, urgency and vaginal discharge. Negative for dyspareunia, flank pain, hematuria, vaginal bleeding and vaginal pain.  Musculoskeletal: Negative for back pain.  Skin: Negative for rash.   BREAST: No symptoms   OBJECTIVE:   Vitals:  BP 100/60   Pulse 71   Ht 5\' 1"  (1.549 m)   Wt 123 lb (55.8 kg)   LMP 06/29/2017   BMI 23.24 kg/m   Physical Exam  Constitutional: She is oriented to person, place, and time and well-developed,  well-nourished, and in no distress. Vital signs are normal.  Pulmonary/Chest: Effort normal.  Abdominal: Normal appearance. There is no rebound.  Genitourinary: Uterus normal, cervix normal, right adnexa normal, left adnexa normal and vulva normal. Uterus is not enlarged. Cervix exhibits no motion tenderness and no tenderness. Right adnexum displays no mass and no tenderness. Left adnexum displays no mass and no tenderness. Vulva exhibits no erythema, no exudate, no lesion, no rash and no tenderness. Vagina exhibits no lesion. Thick  white and vaginal discharge found.  Neurological: She is oriented to person, place, and time.  Psychiatric: Memory, affect and judgment normal.  Vitals reviewed.   Results: Results for orders placed or performed in visit on 07/11/17 (from the past 24 hour(s))  POCT Wet Prep with KOH     Status: Abnormal   Collection Time: 07/11/17  2:45 PM  Result Value Ref Range   Trichomonas, UA Negative    Clue Cells Wet Prep HPF POC neg    Epithelial Wet Prep HPF POC  Few, Moderate, Many, Too numerous to count   Yeast Wet Prep HPF POC few    Bacteria Wet Prep HPF POC  Few   RBC Wet Prep HPF POC     WBC Wet Prep HPF POC     KOH Prep POC Negative Negative  POCT Urinalysis Dipstick     Status: Abnormal   Collection Time: 07/11/17  2:45 PM  Result Value Ref Range   Color, UA yellow    Clarity, UA cloudy    Glucose, UA neg    Bilirubin, UA neg    Ketones, UA neg    Spec Grav, UA 1.025 1.010 - 1.025   Blood, UA mod    pH, UA 5.0 5.0 - 8.0   Protein, UA neg    Urobilinogen, UA  0.2 or 1.0 E.U./dL   Nitrite, UA neg    Leukocytes, UA Moderate (2+) (A) Negative   Appearance     Odor      Assessment/Plan: Acute cystitis with hematuria - Rx macrobid. Check C&S. Pt gets UTIs frequently. Refer to urology for further eval. D/C soda/tea/caffeine and increase water only, void frequently.   - Plan: POCT Urinalysis Dipstick, Urine Culture, nitrofurantoin,  macrocrystal-monohydrate, (MACROBID) 100 MG capsule, Ambulatory referral to Urology  Candidal vaginitis - Pos wet prep. Rx diflucan. F/u prn.  - Plan: POCT Wet Prep with KOH, fluconazole (DIFLUCAN) 150 MG tablet  Pt rarely sex active and still getting UTIs, so doesn't think postcoital macrobid is going to help.  Meds ordered this encounter  Medications  . nitrofurantoin, macrocrystal-monohydrate, (MACROBID) 100 MG capsule    Sig: Take 1 capsule (100 mg total) by mouth 2 (two) times daily for 5 days.  Dispense:  10 capsule    Refill:  0    Order Specific Question:   Supervising Provider    Answer:   Nadara MustardHARRIS, ROBERT P [id number]]  . fluconazole (DIFLUCAN) 150 MG tablet    Sig: Take 1 tablet (150 mg total) by mouth once for 1 dose.    Dispense:  1 tablet    Refill:  0    Order Specific Question:   Supervising Provider    Answer:   Nadara MustardHARRIS, ROBERT P [[id number]]      Return if symptoms worsen or fail to improve.  Lillianne Eick B. Kahiau Schewe, PA-C 07/11/2017 2:47 PM

## 2017-07-13 LAB — URINE CULTURE

## 2017-07-26 ENCOUNTER — Ambulatory Visit: Payer: Managed Care, Other (non HMO) | Admitting: Advanced Practice Midwife

## 2017-08-01 ENCOUNTER — Ambulatory Visit: Payer: Managed Care, Other (non HMO) | Admitting: Urology

## 2017-08-03 ENCOUNTER — Ambulatory Visit: Payer: Managed Care, Other (non HMO) | Admitting: Urology

## 2017-08-08 ENCOUNTER — Ambulatory Visit: Payer: Managed Care, Other (non HMO) | Admitting: Urology

## 2017-08-08 ENCOUNTER — Ambulatory Visit: Payer: Managed Care, Other (non HMO) | Admitting: Unknown Physician Specialty

## 2017-09-05 ENCOUNTER — Ambulatory Visit: Payer: Managed Care, Other (non HMO) | Admitting: Urology

## 2017-09-20 ENCOUNTER — Ambulatory Visit: Payer: Managed Care, Other (non HMO) | Admitting: Advanced Practice Midwife

## 2017-09-21 ENCOUNTER — Telehealth: Payer: Self-pay | Admitting: Unknown Physician Specialty

## 2017-09-21 NOTE — Telephone Encounter (Signed)
Copied from CRM [id number]. Topic: Quick Communication - Rx Refill/Question >> Sep 21, 2017  4:23 PM Floria Raveling A wrote: Medication: epipen  Has the patient contacted their pharmacy? No  (Agent: If no, request that the patient contact the pharmacy for the refill.) (Agent: If yes, when and what did the pharmacy advise?)  Preferred Pharmacy (with phone number or street name): [facility] [address]   Agent: Please be advised that RX refills may take up to 3 business days. We ask that you follow-up with your pharmacy.

## 2017-09-22 NOTE — Telephone Encounter (Signed)
Pt. Reports her pediatrician had been refilling her epi-pen for her shellfish allergy. Asking if [name] could do this for her.

## 2017-09-25 MED ORDER — EPINEPHRINE 0.3 MG/0.3ML IJ SOAJ: 0.3000 mg | Freq: Once | INTRAMUSCULAR | 0 refills | Status: AC

## 2017-09-27 NOTE — Progress Notes (Signed)
09/28/2017 10:24 AM   Allison Sanchez 13-Jul-1995 [id number]  Referring provider: Kathrine Haddock, NP [address], [place] [address]  Chief Complaint  Patient presents with  . Hematuria    HPI: Patient is a 22 -year-old [place] American female who presents today as a referral from their gynecologist, Allison Putt Copland, PA-C, for microscopic hematuria and cystitis.    Patient was found to have microscopic hematuria on several occassions over the last two years with negative urine cultures.   Her UA today is positive for 11-30 RBC's and moderate bacteria.   She has her menstrual cycle today.  She does not remember if she was having her menses at those times.    She is having urge incontinence, dysuria and feelings of incomplete emptying.  Sometimes after sexual activity and after lemonade/Fanta drinks.   She drinks a lot of sodas.    She is not having UTI symptoms at this time.    She does not have a prior history of trauma to the genitourinary tract or malignancies of the genitourinary tract.  She does not have a family medical history of nephrolithiasis, malignancies of the genitourinary tract or hematuria.   Patient denies any gross hematuria, dysuria or suprapubic/flank pain.  Patient denies any fevers, chills, nausea or vomiting.    She is a smoker.   Her mother and sister have sickle cell trait.    C&S results:  GBS 10/17 klebsiella 2/18  E. Coli 7/18 Enterobacter 03/14/17  Her PVR is 41 mL.   PMH: Past Medical History:  Diagnosis Date  . Asthma   . BV (bacterial vaginosis)   . History of twin pregnancy in prior pregnancy   . Premature delivery   . UTI (urinary tract infection)     Surgical History: Past Surgical History:  Procedure Laterality Date  . CESAREAN SECTION N/A 01/10/2016   Procedure: CESAREAN SECTION;  Surgeon: Mora Bellman, MD;  Location: [facility];  Service: Obstetrics;  Laterality: N/A;    Home Medications:  Allergies as of 09/28/2017        Reactions   Shellfish Allergy Swelling      Medication List        Accurate as of 09/28/17 10:24 AM. Always use your most recent med list.          albuterol 108 (90 Base) MCG/ACT inhaler Commonly known as:  PROVENTIL HFA;VENTOLIN HFA Inhale 2 puffs into the lungs every 6 (six) hours as needed.   ciprofloxacin 500 MG tablet Commonly known as:  CIPRO Take 500 mg by mouth 2 (two) times daily.   EPINEPHrine 0.3 mg/0.3 mL Soaj injection Commonly known as:  EPI-PEN   norethindrone-ethinyl estradiol 1-20 MG-MCG tablet Commonly known as:  JUNEL FE 1/20 Take 1 tablet by mouth daily.   phenazopyridine 100 MG tablet Commonly known as:  PYRIDIUM TAKE 1 TABLET (100 MG TOTAL) BY MOUTH 3 (THREE) TIMES DAILY WITH MEALS       Allergies:  Allergies  Allergen Reactions  . Shellfish Allergy Swelling    Family History: Family History  Problem Relation Age of Onset  . Hypertension Mother   . Sickle cell trait Mother   . Hypertension Daughter        pulmonary  . Cancer Paternal Uncle   . Gout Maternal Grandfather   . Cancer Paternal Uncle   . Cancer Paternal Uncle   . Bladder Cancer Neg Hx   . Kidney cancer Neg Hx     Social History:  reports that she has been smoking cigarettes.  She has been smoking about 0.50 packs per day. She has never used smokeless tobacco. She reports that she does not drink alcohol or use drugs.  ROS: UROLOGY Frequent Urination?: Yes Hard to postpone urination?: Yes Burning/pain with urination?: Yes Get up at night to urinate?: No Leakage of urine?: No Urine stream starts and stops?: No Trouble starting stream?: No Do you have to strain to urinate?: No Blood in urine?: No Urinary tract infection?: Yes Sexually transmitted disease?: No Injury to kidneys or bladder?: No Painful intercourse?: No Weak stream?: No Currently pregnant?: No Vaginal bleeding?: No Last menstrual period?: n  Gastrointestinal Nausea?: No Vomiting?:  No Indigestion/heartburn?: No Diarrhea?: No Constipation?: No  Constitutional Fever: No Night sweats?: No Weight loss?: No Fatigue?: No  Skin Skin rash/lesions?: No Itching?: No  Eyes Blurred vision?: No Double vision?: No  Ears/Nose/Throat Sore throat?: No Sinus problems?: No  Hematologic/Lymphatic Swollen glands?: No Easy bruising?: No  Cardiovascular Leg swelling?: No Chest pain?: No  Respiratory Cough?: No Shortness of breath?: No  Endocrine Excessive thirst?: No  Musculoskeletal Back pain?: No Joint pain?: No  Neurological Headaches?: No Dizziness?: No  Psychologic Depression?: No Anxiety?: No  Physical Exam: BP 119/76 (BP Location: Right Arm, Patient Position: Sitting, Cuff Size: Normal)   Pulse 85   Ht _0  (1.549 m)   Wt 125 lb 3.2 oz (56.8 kg)   BMI 23.66 kg/m   Constitutional:  Well nourished. Alert and oriented, No acute distress. HEENT: [place] AT, moist mucus membranes.  Trachea midline, no masses. Cardiovascular: No clubbing, cyanosis, or edema. Respiratory: Normal respiratory effort, no increased work of breathing. GI: Abdomen is soft, non tender, non distended, no abdominal masses. Liver and spleen not palpable.  No hernias appreciated.  Stool sample for occult testing is not indicated.   GU: No CVA tenderness.  No bladder fullness or masses.  Genital exam deferred.   Skin: No rashes, bruises or suspicious lesions. Lymph: No cervical or inguinal adenopathy. Neurologic: Grossly intact, no focal deficits, moving all 4 extremities. Psychiatric: Normal mood and affect.  Laboratory Data: Lab Results  Component Value Date   WBC 15.8 (H) 05/17/2017   HGB 15.1 05/17/2017   HCT 43.8 05/17/2017   MCV 89.5 05/17/2017   PLT 221 05/17/2017    Lab Results  Component Value Date   CREATININE 0.73 05/17/2017    No results found for: PSA  No results found for: TESTOSTERONE  No results found for: HGBA1C  No results found for: TSH  No  results found for: CHOL, HDL, CHOLHDL, VLDL, LDLCALC  Lab Results  Component Value Date   AST 27 05/17/2017   Lab Results  Component Value Date   ALT 13 (L) 05/17/2017   No components found for: ALKALINEPHOPHATASE No components found for: BILIRUBINTOTAL  No results found for: ESTRADIOL   Urinalysis 11-30 RBC's.  Moderate bacteria.  See Epic.    I have reviewed the labs.  Pertinent Imaging: Results for CHEALSEA, PASKE (MRN [id number]) as of 09/28/2017 10:31  Ref. Range 09/28/2017 10:27  Scan Result Unknown 41    Assessment & Plan:    1. Microscopic hematuria Explained to the patient that there are a number of causes that can be associated with blood in the urine, such as stones, UTI's, damage to the urinary tract and/or cancer. Patient is highly allergic to shellfish and does not wish to pursue a CTU at this time and due to her age  and uncertainty of the timing of her menstrual cycle with her UA's, I feel this is reasonable UA - patient on menses BUN + creatinine  Serum pregnancy test  2. Recurrent UTI's  - criteria for recurrent UTI has not been met with 2 or more infections in 6 months or 3 or greater infections in one year   - patient is instructed to increase their water intake until the urine is pale yellow or clear (10 to 12 cups daily)   - patient is instructed to take probiotics (yogurt, oral pills or vaginal suppositories), take cranberry pills or drink the juice and Vitamin C 1,000 mg daily to acidify the urine   - if using tampons, she should remove them prior to urinating and change them often   - avoid soaking in tubs and wipe front to back after urinating   - advised them to have CATH UA's for urinalysis and culture to prevent skin, vaginal and/or rectal contamination of the specimen  - reviewed symptoms of UTI (fevers, chills, gross hematuria, mental status changes, dysuria, suprapubic pain, back pain and/or sudden worsening of urinary symptoms) and advised not to  have urine checked or be treated for UTI if not experiencing symptoms  - discussed antibiotic stewardship with the patient - explained the risk of increasing risk of antibiotic resistance with continuous exposure to antibiotics, renal failure, hypoglycemia, C. Diff infection, allergic reactions, etc.                                         Return for RUS report.  These notes generated with voice recognition software. I apologize for typographical errors.  Zara Council, PA-C  [facility] [address] [place]  [place], [place] [address] [phone]

## 2017-09-28 ENCOUNTER — Ambulatory Visit (INDEPENDENT_AMBULATORY_CARE_PROVIDER_SITE_OTHER): Payer: Managed Care, Other (non HMO) | Admitting: Urology

## 2017-09-28 ENCOUNTER — Encounter: Payer: Self-pay | Admitting: Urology

## 2017-09-28 VITALS — BP 119/76 | HR 85 | Ht 61.0 in | Wt 125.2 lb

## 2017-09-28 DIAGNOSIS — R3129 Other microscopic hematuria: Secondary | ICD-10-CM

## 2017-09-28 DIAGNOSIS — N390 Urinary tract infection, site not specified: Secondary | ICD-10-CM

## 2017-09-28 LAB — URINALYSIS, COMPLETE
BILIRUBIN UA: NEGATIVE
Glucose, UA: NEGATIVE
Ketones, UA: NEGATIVE
Nitrite, UA: NEGATIVE
PH UA: 5 (ref 5.0–7.5)
Protein, UA: NEGATIVE
Specific Gravity, UA: 1.02 (ref 1.005–1.030)
Urobilinogen, Ur: 0.2 mg/dL (ref 0.2–1.0)

## 2017-09-28 LAB — MICROSCOPIC EXAMINATION

## 2017-09-28 LAB — BLADDER SCAN AMB NON-IMAGING: Scan Result: 41

## 2017-09-28 NOTE — Patient Instructions (Signed)

## 2017-09-29 ENCOUNTER — Ambulatory Visit: Payer: Managed Care, Other (non HMO)

## 2017-09-29 LAB — HCG, SERUM, QUALITATIVE: hCG,Beta Subunit,Qual,Serum: NEGATIVE m[IU]/mL (ref <6)

## 2017-09-29 LAB — BUN+CREAT
BUN / CREAT RATIO: 9 (ref 9–23)
BUN: 7 mg/dL (ref 6–20)
Creatinine, Ser: 0.75 mg/dL (ref 0.57–1.00)
GFR, EST AFRICAN AMERICAN: 133 mL/min/{1.73_m2} (ref >59)
GFR, EST NON AFRICAN AMERICAN: 115 mL/min/{1.73_m2} (ref >59)

## 2017-11-09 NOTE — Progress Notes (Signed)
11/13/2017 8:54 AM   Allison Sanchez December 26, 1995 [id number]  Referring provider: Kathrine Haddock, NP [address], [place] [address]  Chief Complaint  Patient presents with  . Results    HPI: Patient is a 22 year old African-American female with a history of hematuria and recurrent urinary tract infection who presents today to discuss renal ultrasound results.  Background history Patient is a 86 -year-old [place] American female who presents today as a referral from their gynecologist, Elmo Putt Copland, PA-C, for microscopic hematuria and cystitis.  Patient was found to have microscopic hematuria on several occassions over the last two years with negative urine cultures.   Her UA today is positive for 11-30 RBC's and moderate bacteria.   She has her menstrual cycle today.  She does not remember if she was having her menses at those times.   She is having urge incontinence, dysuria and feelings of incomplete emptying.  Sometimes after sexual activity and after lemonade/Fanta drinks.   She drinks a lot of sodas.  She is not having UTI symptoms at this time.  She does not have a prior history of trauma to the genitourinary tract or malignancies of the genitourinary tract.  She does not have a family medical history of nephrolithiasis, malignancies of the genitourinary tract or hematuria.  Patient denies any gross hematuria, dysuria or suprapubic/flank pain.  Patient denies any fevers, chills, nausea or vomiting.  She is a smoker.   Her mother and sister have sickle cell trait.    C&S results:  GBS 10/17 klebsiella 2/18  E. Coli 7/18 Enterobacter 03/14/17  Her PVR was 41 mL.   RUS on 11/08/2017 was negative.    UA today is negative for hematuria.    PMH: Past Medical History:  Diagnosis Date  . Asthma   . BV (bacterial vaginosis)   . History of twin pregnancy in prior pregnancy   . Premature delivery   . UTI (urinary tract infection)     Surgical History: Past Surgical History:    Procedure Laterality Date  . CESAREAN SECTION N/A 01/10/2016   Procedure: CESAREAN SECTION;  Surgeon: Mora Bellman, MD;  Location: [facility];  Service: Obstetrics;  Laterality: N/A;    Home Medications:  Allergies as of 11/13/2017      Reactions   Shellfish Allergy Swelling      Medication List        Accurate as of 11/13/17  8:54 AM. Always use your most recent med list.          albuterol 108 (90 Base) MCG/ACT inhaler Commonly known as:  PROVENTIL HFA;VENTOLIN HFA Inhale 2 puffs into the lungs every 6 (six) hours as needed.   EPINEPHrine 0.3 mg/0.3 mL Soaj injection Commonly known as:  EPI-PEN   norethindrone-ethinyl estradiol 1-20 MG-MCG tablet Commonly known as:  JUNEL FE 1/20 Take 1 tablet by mouth daily.   phenazopyridine 100 MG tablet Commonly known as:  PYRIDIUM TAKE 1 TABLET (100 MG TOTAL) BY MOUTH 3 (THREE) TIMES DAILY WITH MEALS       Allergies:  Allergies  Allergen Reactions  . Shellfish Allergy Swelling    Family History: Family History  Problem Relation Age of Onset  . Hypertension Mother   . Sickle cell trait Mother   . Hypertension Daughter        pulmonary  . Cancer Paternal Uncle   . Gout Maternal Grandfather   . Cancer Paternal Uncle   . Cancer Paternal Uncle   . Bladder Cancer  Neg Hx   . Kidney cancer Neg Hx     Social History:  reports that she has been smoking cigarettes.  She has been smoking about 0.50 packs per day. She has never used smokeless tobacco. She reports that she does not drink alcohol or use drugs.  ROS: UROLOGY Frequent Urination?: No Hard to postpone urination?: No Burning/pain with urination?: No Get up at night to urinate?: No Leakage of urine?: No Urine stream starts and stops?: No Trouble starting stream?: No Do you have to strain to urinate?: No Blood in urine?: No Urinary tract infection?: No Sexually transmitted disease?: No Injury to kidneys or bladder?: No Painful intercourse?: No Weak  stream?: No Currently pregnant?: No Vaginal bleeding?: No Last menstrual period?: 10/30/17  Gastrointestinal Nausea?: No Vomiting?: No Indigestion/heartburn?: No Diarrhea?: No Constipation?: No  Constitutional Fever: No Night sweats?: No Weight loss?: No Fatigue?: No  Skin Skin rash/lesions?: No Itching?: No  Eyes Blurred vision?: No Double vision?: No  Ears/Nose/Throat Sore throat?: No Sinus problems?: No  Hematologic/Lymphatic Swollen glands?: No Easy bruising?: No  Cardiovascular Leg swelling?: No Chest pain?: No  Respiratory Cough?: No Shortness of breath?: No  Endocrine Excessive thirst?: No  Musculoskeletal Back pain?: No Joint pain?: No  Neurological Headaches?: No Dizziness?: No  Psychologic Depression?: No Anxiety?: No  Physical Exam: BP 121/80 (BP Location: Left Arm, Patient Position: Sitting, Cuff Size: Normal)   Pulse 69   Ht '5\' 1"'  (1.549 m)   Wt 124 lb 6.4 oz (56.4 kg)   LMP 10/30/2017   BMI 23.51 kg/m   Constitutional: Well nourished. Alert and oriented, No acute distress. HEENT: [place] AT, moist mucus membranes. Trachea midline, no masses. Cardiovascular: No clubbing, cyanosis, or edema. Respiratory: Normal respiratory effort, no increased work of breathing. Skin: No rashes, bruises or suspicious lesions. Lymph: No cervical or inguinal adenopathy. Neurologic: Grossly intact, no focal deficits, moving all 4 extremities. Psychiatric: Normal mood and affect.   Laboratory Data: Lab Results  Component Value Date   WBC 15.8 (H) 05/17/2017   HGB 15.1 05/17/2017   HCT 43.8 05/17/2017   MCV 89.5 05/17/2017   PLT 221 05/17/2017    Lab Results  Component Value Date   CREATININE 0.75 09/28/2017    No results found for: PSA  No results found for: TESTOSTERONE  No results found for: HGBA1C  No results found for: TSH  No results found for: CHOL, HDL, CHOLHDL, VLDL, LDLCALC  Lab Results  Component Value Date   AST 27  05/17/2017   Lab Results  Component Value Date   ALT 13 (L) 05/17/2017   No components found for: ALKALINEPHOPHATASE No components found for: BILIRUBINTOTAL  No results found for: ESTRADIOL   Urinalysis See HPI and see Epic.    I have reviewed the labs.   Pertinent Imaging: CLINICAL DATA:  Microhematuria  EXAM: RENAL / URINARY TRACT ULTRASOUND COMPLETE  COMPARISON:  None.  FINDINGS: Right Kidney:  Length: 10.3 cm. Echogenicity and renal cortical thickness are within normal limits. No mass, perinephric fluid, or hydronephrosis visualized. No sonographically demonstrable calculus or ureterectasis.  Left Kidney:  Length: 10.6 cm. Echogenicity and renal cortical thickness are within normal limits. No mass, perinephric fluid, or hydronephrosis visualized. No sonographically demonstrable calculus or ureterectasis.  Bladder:  Appears normal for degree of bladder distention.  IMPRESSION: Study within normal limits.   Electronically Signed   By: Lowella Grip III M.D.   On: 11/10/2017 20:46  I have independently reviewed the films  Assessment & Plan:    1. Microscopic hematuria RUS was negative  UA with no hematuria    2. Recurrent UTI's Criteria for recurrent UTI has not been met with 2 or more infections in 6 months or 3 or greater infections in one year  Reviewed UTI prevention strategies Advised them to have CATH UA's for urinalysis and culture when possible to prevent skin, vaginal and/or rectal contamination of the specimen Reviewed symptoms of UTI (fevers, chills, gross hematuria, mental status changes, dysuria, suprapubic pain, back pain and/or sudden worsening of urinary symptoms) and advised not to have urine checked or be treated for UTI if not experiencing symptoms Discussed antibiotic stewardship with the patient - explained the risk of increasing risk of antibiotic resistance with continuous exposure to antibiotics, renal failure,  hypoglycemia, C. Diff infection, allergic reactions, etc.                                         Return in about 1 month (around 12/14/2017) for UA only .  These notes generated with voice recognition software. I apologize for typographical errors.  Zara Council, PA-C  [facility] [address] [place]  [place],  [address] [phone]

## 2017-11-10 ENCOUNTER — Ambulatory Visit
Admission: RE | Admit: 2017-11-10 | Discharge: 2017-11-10 | Disposition: A | Discharge status: Home / Self Care | Payer: Managed Care, Other (non HMO) | Source: Ambulatory Visit | Attending: Urology | Admitting: Urology

## 2017-11-10 DIAGNOSIS — R3129 Other microscopic hematuria: Secondary | ICD-10-CM | POA: Insufficient documentation

## 2017-11-13 ENCOUNTER — Encounter: Payer: Self-pay | Admitting: Urology

## 2017-11-13 ENCOUNTER — Ambulatory Visit: Payer: Managed Care, Other (non HMO) | Admitting: Urology

## 2017-11-13 VITALS — BP 121/80 | HR 69 | Ht 61.0 in | Wt 124.4 lb

## 2017-11-13 DIAGNOSIS — N390 Urinary tract infection, site not specified: Secondary | ICD-10-CM

## 2017-11-13 DIAGNOSIS — R3129 Other microscopic hematuria: Secondary | ICD-10-CM

## 2017-11-13 LAB — URINALYSIS, COMPLETE
Bilirubin, UA: NEGATIVE
Glucose, UA: NEGATIVE
KETONES UA: NEGATIVE
LEUKOCYTES UA: NEGATIVE
Nitrite, UA: NEGATIVE
PROTEIN UA: NEGATIVE
RBC, UA: NEGATIVE
Specific Gravity, UA: 1.02 (ref 1.005–1.030)
Urobilinogen, Ur: 0.2 mg/dL (ref 0.2–1.0)
pH, UA: 6 (ref 5.0–7.5)

## 2017-11-13 LAB — MICROSCOPIC EXAMINATION: RBC, UA: NONE SEEN /hpf (ref 0–2)

## 2017-11-17 ENCOUNTER — Ambulatory Visit: Payer: Managed Care, Other (non HMO)

## 2017-12-08 ENCOUNTER — Other Ambulatory Visit: Payer: Self-pay | Admitting: Family Medicine

## 2017-12-08 DIAGNOSIS — R3129 Other microscopic hematuria: Secondary | ICD-10-CM

## 2017-12-14 ENCOUNTER — Other Ambulatory Visit: Payer: Managed Care, Other (non HMO)

## 2017-12-14 DIAGNOSIS — R3129 Other microscopic hematuria: Secondary | ICD-10-CM

## 2017-12-14 LAB — MICROSCOPIC EXAMINATION
RBC, UA: NONE SEEN /hpf (ref 0–2)
WBC, UA: NONE SEEN /hpf (ref 0–5)

## 2017-12-14 LAB — URINALYSIS, COMPLETE
Bilirubin, UA: NEGATIVE
GLUCOSE, UA: NEGATIVE
KETONES UA: NEGATIVE
LEUKOCYTES UA: NEGATIVE
Nitrite, UA: NEGATIVE
Protein, UA: NEGATIVE
RBC, UA: NEGATIVE
SPEC GRAV UA: 1.02 (ref 1.005–1.030)
Urobilinogen, Ur: 0.2 mg/dL (ref 0.2–1.0)
pH, UA: 6.5 (ref 5.0–7.5)

## 2017-12-31 ENCOUNTER — Telehealth: Payer: Self-pay | Admitting: Urology

## 2017-12-31 NOTE — Telephone Encounter (Signed)
Please let Kayelani know that her UA was negative for blood.  I would like to recheck it one more time in three months and she should contact us with any gross hematuria in the interim.

## 2018-01-02 NOTE — Telephone Encounter (Signed)
Left detailed message for pt 

## 2018-01-29 ENCOUNTER — Ambulatory Visit (INDEPENDENT_AMBULATORY_CARE_PROVIDER_SITE_OTHER): Payer: Managed Care, Other (non HMO)

## 2018-01-29 VITALS — BP 107/73 | HR 76 | Ht 61.0 in | Wt 130.0 lb

## 2018-01-29 DIAGNOSIS — R300 Dysuria: Secondary | ICD-10-CM | POA: Diagnosis not present

## 2018-01-29 LAB — URINALYSIS, COMPLETE
Bilirubin, UA: NEGATIVE
GLUCOSE, UA: NEGATIVE
KETONES UA: NEGATIVE
Nitrite, UA: POSITIVE — AB
Urobilinogen, Ur: 0.2 mg/dL (ref 0.2–1.0)
pH, UA: 5.5 (ref 5.0–7.5)

## 2018-01-29 LAB — MICROSCOPIC EXAMINATION: WBC, UA: >30 /hpf — ABNORMAL HIGH (ref 0–5)

## 2018-01-29 MED ORDER — AMOXICILLIN-POT CLAVULANATE 875-125 MG PO TABS: 1.0000 | ORAL_TABLET | Freq: Two times a day (BID) | ORAL | 0 refills | Status: DC

## 2018-01-29 NOTE — Progress Notes (Signed)
Patient present today complaining of urinary frequency and dysuria starting last night. Denies fever and chills.  Patient was instructed to start AZO OTC to help with burning and increase water intake.  Patient was notified that her urine today is frankly positive for infection per Michiel Cowboy, PAC abx was sent to pharm. Will call with culture results.

## 2018-02-01 LAB — CULTURE, URINE COMPREHENSIVE

## 2018-02-26 ENCOUNTER — Encounter: Payer: Self-pay | Admitting: Obstetrics and Gynecology

## 2018-02-26 ENCOUNTER — Other Ambulatory Visit (HOSPITAL_COMMUNITY)
Admission: RE | Admit: 2018-02-26 | Discharge: 2018-02-26 | Disposition: A | Discharge status: Home / Self Care | Payer: Managed Care, Other (non HMO) | Source: Ambulatory Visit | Attending: Obstetrics and Gynecology | Admitting: Obstetrics and Gynecology

## 2018-02-26 ENCOUNTER — Ambulatory Visit (INDEPENDENT_AMBULATORY_CARE_PROVIDER_SITE_OTHER): Payer: Managed Care, Other (non HMO) | Admitting: Obstetrics and Gynecology

## 2018-02-26 VITALS — BP 100/60 | HR 72 | Ht 61.0 in | Wt 131.0 lb

## 2018-02-26 DIAGNOSIS — N898 Other specified noninflammatory disorders of vagina: Secondary | ICD-10-CM

## 2018-02-26 DIAGNOSIS — N760 Acute vaginitis: Secondary | ICD-10-CM | POA: Diagnosis not present

## 2018-02-26 DIAGNOSIS — B9689 Other specified bacterial agents as the cause of diseases classified elsewhere: Secondary | ICD-10-CM | POA: Diagnosis not present

## 2018-02-26 DIAGNOSIS — Z113 Encounter for screening for infections with a predominantly sexual mode of transmission: Secondary | ICD-10-CM | POA: Diagnosis present

## 2018-02-26 LAB — POCT WET PREP WITH KOH
Clue Cells Wet Prep HPF POC: POSITIVE
KOH PREP POC: POSITIVE — AB
TRICHOMONAS UA: NEGATIVE
YEAST WET PREP PER HPF POC: NEGATIVE

## 2018-02-26 MED ORDER — FLUCONAZOLE 150 MG PO TABS: 150.0000 mg | ORAL_TABLET | Freq: Once | ORAL | 0 refills | Status: AC

## 2018-02-26 MED ORDER — SECNIDAZOLE 2 G PO PACK: 2.0000 g | PACK | Freq: Once | ORAL | 0 refills | Status: AC

## 2018-02-26 NOTE — Progress Notes (Signed)
Allison Cirri, Allison Sanchez   Chief Complaint  Patient presents with  . possible BV    discharge, odor, itchiness x 4 days    HPI:      Allison Sanchez is a 22 y.o. [id number] who LMP was Patient's last menstrual period was 02/17/2018 (exact date)., presents today for increased vag d/c, irritation, and odor for the past 5 days. Pt was on Augmentin about a month ago for UTI. No urin sx today. No LBP, belly pain, fevers. Hx of BV in past. Seeing urology for recurrent UTIs.  Using dryer sheets, suave soap, no probiotic use.  No recent STD testing done.   Past Medical History:  Diagnosis Date  . Asthma   . BV (bacterial vaginosis)   . History of twin pregnancy in prior pregnancy   . Premature delivery   . UTI (urinary tract infection)     Past Surgical History:  Procedure Laterality Date  . CESAREAN SECTION N/A 01/10/2016   Procedure: CESAREAN SECTION;  Surgeon: Catalina Antigua, MD;  Location: [facility];  Service: Obstetrics;  Laterality: N/A;    Family History  Problem Relation Age of Onset  . Hypertension Mother   . Sickle cell trait Mother   . Hypertension Daughter        pulmonary  . Cancer Paternal Uncle   . Gout Maternal Grandfather   . Cancer Paternal Uncle   . Cancer Paternal Uncle   . Bladder Cancer Neg Hx   . Kidney cancer Neg Hx     Social History   Socioeconomic History  . Marital status: Single    Spouse name: Not on file  . Number of children: Not on file  . Years of education: Not on file  . Highest education level: Not on file  Occupational History  . Not on file  Social Needs  . Financial resource strain: Not on file  . Food insecurity:    Worry: Not on file    Inability: Not on file  . Transportation needs:    Medical: Not on file    Non-medical: Not on file  Tobacco Use  . Smoking status: Current Every Day Smoker    Packs/day: 0.50    Types: Cigarettes  . Smokeless tobacco: Never Used  . Tobacco comment: 1-2 cigarettes daily    Substance and Sexual Activity  . Alcohol use: No  . Drug use: No  . Sexual activity: Yes    Birth control/protection: Pill  Lifestyle  . Physical activity:    Days per week: Not on file    Minutes per session: Not on file  . Stress: Not on file  Relationships  . Social connections:    Talks on phone: Not on file    Gets together: Not on file    Attends religious service: Not on file    Active member of club or organization: Not on file    Attends meetings of clubs or organizations: Not on file    Relationship status: Not on file  . Intimate partner violence:    Fear of current or ex partner: Not on file    Emotionally abused: Not on file    Physically abused: Not on file    Forced sexual activity: Not on file  Other Topics Concern  . Not on file  Social History Narrative  . Not on file    Outpatient Medications Prior to Visit  Medication Sig Dispense Refill  . albuterol (PROVENTIL HFA;VENTOLIN HFA) 108 (  90 Base) MCG/ACT inhaler Inhale 2 puffs into the lungs every 6 (six) hours as needed. 1 Inhaler 2  . EPINEPHrine 0.3 mg/0.3 mL IJ SOAJ injection     . norethindrone-ethinyl estradiol (JUNEL FE 1/20) 1-20 MG-MCG tablet Take 1 tablet by mouth daily. 1 Package 11  . amoxicillin-clavulanate (AUGMENTIN) 875-125 MG tablet Take 1 tablet by mouth every 12 (twelve) hours. 14 tablet 0   No facility-administered medications prior to visit.     ROS:  Review of Systems  Constitutional: Negative for fever.  Gastrointestinal: Negative for blood in stool, constipation, diarrhea, nausea and vomiting.  Genitourinary: Positive for vaginal discharge. Negative for dyspareunia, dysuria, flank pain, frequency, hematuria, urgency, vaginal bleeding and vaginal pain.  Musculoskeletal: Negative for back pain.  Skin: Negative for rash.      OBJECTIVE:   Vitals:  BP 100/60   Pulse 72   Ht 5\' 1"  (1.549 m)   Wt 131 lb (59.4 kg)   LMP 02/17/2018 (Exact Date)   BMI 24.75 kg/m   Physical  Exam  Constitutional: She is oriented to person, place, and time. Vital signs are normal. She appears well-developed.  Pulmonary/Chest: Effort normal.  Genitourinary: Uterus normal.    There is no rash, tenderness or lesion on the right labia. There is no rash, tenderness or lesion on the left labia. Uterus is not enlarged and not tender. Cervix exhibits no motion tenderness. Right adnexum displays no mass and no tenderness. Left adnexum displays no mass and no tenderness. No erythema or tenderness in the vagina. Vaginal discharge found.  Genitourinary Comments: ERYTHEMA/HYPOPIGMENTATION PERINEAL AREA BILAT  Musculoskeletal: Normal range of motion.  Neurological: She is alert and oriented to person, place, and time.  Psychiatric: She has a normal mood and affect. Her behavior is normal. Thought content normal.  Vitals reviewed.   Results: Results for orders placed or performed in visit on 02/26/18 (from the past 24 hour(s))  POCT Wet Prep with KOH     Status: Abnormal   Collection Time: 02/26/18  9:58 AM  Result Value Ref Range   Trichomonas, UA Negative    Clue Cells Wet Prep HPF POC POS    Epithelial Wet Prep HPF POC     Yeast Wet Prep HPF POC NEG    Bacteria Wet Prep HPF POC     RBC Wet Prep HPF POC     WBC Wet Prep HPF POC     KOH Prep POC Positive (A) Negative     Assessment/Plan: Bacterial vaginosis - Pos sx/wet prep. Rx solosec/coupon card. Add probiotics/dove sens skin soap/line dry underwear. F/u prn.  - Plan: POCT Wet Prep with KOH, Secnidazole (SOLOSEC) 2 g PACK  Vaginal irritation - Ext irritation. Rx diflucan/OTC hydrocortisone crm. Dove sens skin soap/no dryer sheets. - Plan: POCT Wet Prep with KOH, fluconazole (DIFLUCAN) 150 MG tablet  Screening for STD (sexually transmitted disease) - Plan: Cervicovaginal ancillary only    Meds ordered this encounter  Medications  . Secnidazole (SOLOSEC) 2 g PACK    Sig: Take 2 g by mouth once for 1 dose. Mix 2 g granules  with yogurt or pudding for 1 dose. PT HAS COUPON CARD--USE AS PRIMARY    Dispense:  1 each    Refill:  0    Order Specific Question:   Supervising Provider    Answer:   Nadara Mustard [id number]  . fluconazole (DIFLUCAN) 150 MG tablet    Sig: Take 1 tablet (150 mg total) by mouth once  for 1 dose.    Dispense:  1 tablet    Refill:  0    Order Specific Question:   Supervising Provider    Answer:   Nadara Mustard [[id number]]      Return if symptoms worsen or fail to improve.  Zanaria Morell B. Emery Dupuy, PA-C 02/26/2018 9:59 AM

## 2018-02-26 NOTE — Patient Instructions (Signed)
I value your feedback and entrusting us with your care. If you get a [facility] patient survey, I would appreciate you taking the time to let us know about your experience today. Thank you! 

## 2018-02-27 LAB — CERVICOVAGINAL ANCILLARY ONLY
CHLAMYDIA, DNA PROBE: NEGATIVE
NEISSERIA GONORRHEA: NEGATIVE

## 2018-03-02 ENCOUNTER — Telehealth: Payer: Self-pay

## 2018-03-02 NOTE — Telephone Encounter (Signed)
Please advise 

## 2018-03-02 NOTE — Telephone Encounter (Signed)
Pt was seen Mon; dx'd c BV; got rx for powder form of medication.  She threw it up.  Can she have the cream or pill form?  [phone]

## 2018-03-04 ENCOUNTER — Other Ambulatory Visit: Payer: Self-pay | Admitting: Obstetrics and Gynecology

## 2018-03-04 MED ORDER — METRONIDAZOLE 0.75 % VA GEL: 1.0000 | Freq: Every day | VAGINAL | 0 refills | Status: DC

## 2018-03-04 NOTE — Telephone Encounter (Signed)
Rx metrogel eRxd.

## 2018-03-04 NOTE — Telephone Encounter (Signed)
Rx metrogel eRxd. Pls notify pt. Thx

## 2018-03-05 NOTE — Telephone Encounter (Signed)
Pt aware.

## 2018-04-09 ENCOUNTER — Other Ambulatory Visit: Payer: Managed Care, Other (non HMO)

## 2018-04-09 ENCOUNTER — Telehealth: Payer: Self-pay | Admitting: Urology

## 2018-04-09 DIAGNOSIS — N390 Urinary tract infection, site not specified: Secondary | ICD-10-CM

## 2018-04-09 LAB — MICROSCOPIC EXAMINATION: RBC MICROSCOPIC, UA: NONE SEEN /HPF (ref 0–2)

## 2018-04-09 LAB — URINALYSIS, COMPLETE
Bilirubin, UA: NEGATIVE
GLUCOSE, UA: NEGATIVE
Ketones, UA: NEGATIVE
NITRITE UA: NEGATIVE
PH UA: 6 (ref 5.0–7.5)
Protein, UA: NEGATIVE
Specific Gravity, UA: 1.02 (ref 1.005–1.030)
Urobilinogen, Ur: 0.2 mg/dL (ref 0.2–1.0)

## 2018-04-09 NOTE — Telephone Encounter (Signed)
Called patient regarding results from UA collected this morning. Advised patient that urine is being sent out for culture and provider would like to wait for results before treating. Will call patient with results of culture.

## 2018-04-09 NOTE — Telephone Encounter (Addendum)
Pt thinks she has UTI, symptoms are burning with urination, dark cloudy urine, pt denies fever, chills, nausea, vomiting and any flank pain. Pt also states she's had small amounts of blood in urine for about a week. Pt coming in today to give urine sample in the lab schedule. FYI

## 2018-04-12 LAB — CULTURE, URINE COMPREHENSIVE

## 2018-04-16 ENCOUNTER — Telehealth: Payer: Self-pay

## 2018-04-16 NOTE — Telephone Encounter (Signed)
App has been made ° ° °Michelle °

## 2018-04-16 NOTE — Telephone Encounter (Signed)
mychart message sent

## 2018-04-16 NOTE — Telephone Encounter (Signed)
-----   Message from Harle BattiestShannon A McGowan, PA-C sent at 04/13/2018  3:26 PM EST ----- She should make a follow up appointment, so we can evaluate her further.

## 2018-04-26 ENCOUNTER — Ambulatory Visit: Payer: Managed Care, Other (non HMO) | Admitting: Obstetrics and Gynecology

## 2018-05-17 ENCOUNTER — Ambulatory Visit: Payer: Managed Care, Other (non HMO) | Admitting: Urology

## 2018-05-22 ENCOUNTER — Other Ambulatory Visit: Payer: Self-pay | Admitting: Unknown Physician Specialty

## 2018-05-22 DIAGNOSIS — Z3041 Encounter for surveillance of contraceptive pills: Secondary | ICD-10-CM

## 2018-05-22 NOTE — Telephone Encounter (Signed)
Refill for 30 days.  Needs visit for annual labs with BCP.

## 2018-05-22 NOTE — Telephone Encounter (Signed)
Please alert patient she needs follow-up since it has been one year.  With birth control like to obtain yearly labs.  Thank you.  Will refill one time for 30 days, but she needs to come in.

## 2018-05-22 NOTE — Telephone Encounter (Signed)
Requested medication (s) are due for refill today:  yes  Requested medication (s) are on the active medication list:  yes  Future visit scheduled:  no  Last Refill: 05/08/17; 1 pkg. ; RF x 11  FYI: Called pt. to schedule f/u appt. (was to f/u in Apr. 2019)  Stated she does not have insurance at this time, so was unable to schedule appt.  I advised her that I was not able to refill this medication, since she is overdue for OV, and unable to schedule at this time.  Verb. Understanding.    Requested Prescriptions  Pending Prescriptions Disp Refills   JUNEL FE 1/20 1-20 MG-MCG tablet [Pharmacy Med Name: JUNEL FE 1 MG-20 MCG TABLET] 28 tablet 11    Sig: TAKE 1 TABLET BY MOUTH EVERY DAY     OB/GYN:  Contraceptives Failed - 05/22/2018  1:23 AM      Failed - Valid encounter within last 12 months    Recent Outpatient Visits          1 year ago [facility] Cirri[facility]icker, Cheryl, NP   1 year ago Acute sinusitis, recurrence not specified, unspecified location   [facility] Red[facility] GainerMark A, MD   1 year ago Rib pain on left side   [facility] CirriWicker, Cheryl, NP             Passed - Last BP in normal range    BP Readings from Last 1 Encounters:  02/26/18 100/60

## 2019-02-27 ENCOUNTER — Emergency Department
Admission: EM | Admit: 2019-02-27 | Discharge: 2019-02-27 | Disposition: A | Discharge status: Home / Self Care | Payer: Managed Care, Other (non HMO) | Attending: Emergency Medicine | Admitting: Emergency Medicine

## 2019-02-27 ENCOUNTER — Encounter: Payer: Self-pay | Admitting: Emergency Medicine

## 2019-02-27 ENCOUNTER — Other Ambulatory Visit: Payer: Self-pay

## 2019-02-27 DIAGNOSIS — N390 Urinary tract infection, site not specified: Secondary | ICD-10-CM | POA: Insufficient documentation

## 2019-02-27 DIAGNOSIS — J45909 Unspecified asthma, uncomplicated: Secondary | ICD-10-CM | POA: Insufficient documentation

## 2019-02-27 DIAGNOSIS — Z87891 Personal history of nicotine dependence: Secondary | ICD-10-CM | POA: Insufficient documentation

## 2019-02-27 DIAGNOSIS — R55 Syncope and collapse: Secondary | ICD-10-CM

## 2019-02-27 LAB — BASIC METABOLIC PANEL WITH GFR
Anion gap: 9 (ref 5–15)
BUN: 10 mg/dL (ref 6–20)
CO2: 26 mmol/L (ref 22–32)
Calcium: 9.3 mg/dL (ref 8.9–10.3)
Chloride: 104 mmol/L (ref 98–111)
Creatinine, Ser: 0.74 mg/dL (ref 0.44–1.00)
GFR calc Af Amer: >60 mL/min (ref >60)
GFR calc non Af Amer: >60 mL/min (ref >60)
Glucose, Bld: 98 mg/dL (ref 70–99)
Potassium: 3.9 mmol/L (ref 3.5–5.1)
Sodium: 139 mmol/L (ref 135–145)

## 2019-02-27 LAB — URINALYSIS, COMPLETE (UACMP) WITH MICROSCOPIC
Bilirubin Urine: NEGATIVE
Glucose, UA: NEGATIVE mg/dL
Hgb urine dipstick: NEGATIVE
Ketones, ur: NEGATIVE mg/dL
Nitrite: POSITIVE — AB
Protein, ur: NEGATIVE mg/dL
Specific Gravity, Urine: 1.013 (ref 1.005–1.030)
pH: 6 (ref 5.0–8.0)

## 2019-02-27 LAB — CBC
HCT: 39.6 % (ref 36.0–46.0)
Hemoglobin: 13.5 g/dL (ref 12.0–15.0)
MCH: 30.6 pg (ref 26.0–34.0)
MCHC: 34.1 g/dL (ref 30.0–36.0)
MCV: 89.8 fL (ref 80.0–100.0)
Platelets: 261 10*3/uL (ref 150–400)
RBC: 4.41 MIL/uL (ref 3.87–5.11)
RDW: 11.3 % — ABNORMAL LOW (ref 11.5–15.5)
WBC: 9.5 10*3/uL (ref 4.0–10.5)
nRBC: 0 % (ref 0.0–0.2)

## 2019-02-27 LAB — POCT PREGNANCY, URINE: Preg Test, Ur: NEGATIVE

## 2019-02-27 LAB — TROPONIN I (HIGH SENSITIVITY): Troponin I (High Sensitivity): <2 ng/L (ref <18)

## 2019-02-27 MED ORDER — CEPHALEXIN 500 MG PO CAPS: 500.0000 mg | ORAL_CAPSULE | Freq: Three times a day (TID) | ORAL | 0 refills | Status: DC

## 2019-02-27 MED ORDER — CEPHALEXIN 500 MG PO CAPS
500.0000 mg | ORAL_CAPSULE | Freq: Once | ORAL | Status: AC
  Administered 2019-02-27: 21:00:00 500 mg via ORAL
  Filled 2019-02-27: qty 1

## 2019-02-27 NOTE — ED Provider Notes (Signed)
[facility] Emergency Department Provider Note  Time seen: 8:38 PM  I have reviewed the triage vital signs and the nursing notes.   HISTORY  Chief Complaint Near Syncope  HPI Allison Sanchez is a 23 y.o. female with a past medical history of asthma, presents to the emergency department for a near syncopal episode.  According to the patient over the past 1 week she has had 2 near syncopal episodes.  Patient denies any fever cough congestion or shortness of breath.  Denies any chest pain.  Largely negative review of systems.  Currently feels normal.  Past Medical History:  Diagnosis Date  . Asthma   . BV (bacterial vaginosis)   . History of twin pregnancy in prior pregnancy   . Premature delivery   . UTI (urinary tract infection)     Patient Active Problem List   Diagnosis Date Noted  . Left-sided chest wall pain 05/08/2017  . Gastroesophageal reflux disease with esophagitis 02/26/2014    Past Surgical History:  Procedure Laterality Date  . CESAREAN SECTION N/A 01/10/2016   Procedure: CESAREAN SECTION;  Surgeon: Mora Bellman, MD;  Location: [place];  Service: Obstetrics;  Laterality: N/A;    Prior to Admission medications   Medication Sig Start Date End Date Taking? Authorizing Provider  albuterol (PROVENTIL HFA;VENTOLIN HFA) 108 (90 Base) MCG/ACT inhaler Inhale 2 puffs into the lungs every 6 (six) hours as needed. 05/08/17   Kathrine Haddock, NP  EPINEPHrine 0.3 mg/0.3 mL IJ SOAJ injection  09/26/17   [provider]  JUNEL FE 1/20 1-20 MG-MCG tablet TAKE 1 TABLET BY MOUTH EVERY DAY 05/22/18   Marnee Guarneri T, NP    Allergies  Allergen Reactions  . Shellfish Allergy Swelling    Family History  Problem Relation Age of Onset  . Hypertension Mother   . Sickle cell trait Mother   . Hypertension Daughter        pulmonary  . Cancer Paternal Uncle   . Gout Maternal Grandfather   . Cancer Paternal Uncle   . Cancer Paternal Uncle    . Bladder Cancer Neg Hx   . Kidney cancer Neg Hx     Social History Social History   Tobacco Use  . Smoking status: Former Smoker    Types: Cigarettes  . Smokeless tobacco: Never Used  Substance Use Topics  . Alcohol use: No  . Drug use: No    Review of Systems Constitutional: Negative for fever. Cardiovascular: Negative for chest pain. Respiratory: Negative for shortness of breath. Gastrointestinal: Negative for abdominal pain, vomiting and diarrhea. Genitourinary: Negative for urinary compaints Musculoskeletal: Negative for musculoskeletal complaints Neurological: Negative for headache All other ROS negative  ____________________________________________   PHYSICAL EXAM:  VITAL SIGNS: ED Triage Vitals  Enc Vitals Group     BP 02/27/19 1827 130/77     Pulse Rate 02/27/19 1827 69     Resp 02/27/19 1827 18     Temp 02/27/19 1827 98.4 F (36.9 C)     Temp Source 02/27/19 1827 Oral     SpO2 02/27/19 1827 100 %     Weight 02/27/19 1826 138 lb (62.6 kg)     Height 02/27/19 1826 5\' 1"  (1.549 m)     Head Circumference --      Peak Flow --      Pain Score 02/27/19 1837 5     Pain Loc --      Pain Edu? --  Excl. in [place]? --    Constitutional: Alert and oriented. Well appearing and in no distress. Eyes: Normal exam ENT      Head: Normocephalic and atraumatic.      Mouth/Throat: Mucous membranes are moist. Cardiovascular: Normal rate, regular rhythm. Respiratory: Normal respiratory effort without tachypnea nor retractions. Breath sounds are clear. Gastrointestinal: Soft and nontender. No distention.   Musculoskeletal: Nontender with normal range of motion in all extremities.  Neurologic:  Normal speech and language. No gross focal neurologic deficits Skin:  Skin is warm, dry and intact.  Psychiatric: Mood and affect are normal.  ____________________________________________    EKG  EKG viewed and interpreted by myself shows a normal sinus rhythm at 65 bpm  with a narrow QRS, normal axis, normal intervals.  No ST changes.  Do not agree with computer EKG interpretation.  ____________________________________________   INITIAL IMPRESSION / ASSESSMENT AND PLAN / ED COURSE  Pertinent labs & imaging results that were available during my care of the patient were reviewed by me and considered in my medical decision making (see chart for details).   Patient presents to the emergency department for an episode of near syncope.  Patient denies any chest pain or shortness of breath at any point.  Overall the patient appears well with reassuring labs besides a urinary tract infection.  Patient states a history of frequent urinary tract infections.  Has a follow-up with urology already.  Overall patient appears well.  Reassuring work-up including vitals labs and EKG.  Did not agree with computer interpretation of EKG.  We will start the patient on antibiotic, send urine culture and have the patient follow-up with her PCP.  Patient agreeable to plan of care.  Allison Sanchez was evaluated in Emergency Department on 02/27/2019 for the symptoms described in the history of present illness. She was evaluated in the context of the global COVID-19 pandemic, which necessitated consideration that the patient might be at risk for infection with the SARS-CoV-2 virus that causes COVID-19. Institutional protocols and algorithms that pertain to the evaluation of patients at risk for COVID-19 are in a state of rapid change based on information released by regulatory bodies including the CDC and federal and state organizations. These policies and algorithms were followed during the patient's care in the ED.  ____________________________________________   FINAL CLINICAL IMPRESSION(S) / ED DIAGNOSES  Urinary tract infection Near syncope   Allison Antis, MD 02/27/19 2044

## 2019-02-27 NOTE — ED Triage Notes (Signed)
Pt in via POV with frequent near syncopal episodes.  States episodes have been happening since October but have become more frequent.  Reports recent pregnancy and abortion; states episodes began around the same time she found out she was pregnant.  NAD noted at this time.

## 2019-03-01 LAB — URINE CULTURE: Culture: >=100000 — AB

## 2019-04-22 ENCOUNTER — Other Ambulatory Visit: Payer: Self-pay

## 2019-04-22 ENCOUNTER — Encounter: Payer: Self-pay | Admitting: Emergency Medicine

## 2019-04-22 DIAGNOSIS — G43709 Chronic migraine without aura, not intractable, without status migrainosus: Secondary | ICD-10-CM | POA: Insufficient documentation

## 2019-04-22 DIAGNOSIS — Z87891 Personal history of nicotine dependence: Secondary | ICD-10-CM | POA: Insufficient documentation

## 2019-04-22 DIAGNOSIS — Z79899 Other long term (current) drug therapy: Secondary | ICD-10-CM | POA: Insufficient documentation

## 2019-04-22 LAB — COMPREHENSIVE METABOLIC PANEL
ALT: 12 U/L (ref 0–44)
AST: 17 U/L (ref 15–41)
Albumin: 4.4 g/dL (ref 3.5–5.0)
Alkaline Phosphatase: 81 U/L (ref 38–126)
Anion gap: 8 (ref 5–15)
BUN: 13 mg/dL (ref 6–20)
CO2: 25 mmol/L (ref 22–32)
Calcium: 9.4 mg/dL (ref 8.9–10.3)
Chloride: 104 mmol/L (ref 98–111)
Creatinine, Ser: 0.69 mg/dL (ref 0.44–1.00)
GFR calc Af Amer: >60 mL/min (ref >60)
GFR calc non Af Amer: >60 mL/min (ref >60)
Glucose, Bld: 91 mg/dL (ref 70–99)
Potassium: 3.9 mmol/L (ref 3.5–5.1)
Sodium: 137 mmol/L (ref 135–145)
Total Bilirubin: 0.6 mg/dL (ref 0.3–1.2)
Total Protein: 8.2 g/dL — ABNORMAL HIGH (ref 6.5–8.1)

## 2019-04-22 LAB — URINALYSIS, COMPLETE (UACMP) WITH MICROSCOPIC
Bacteria, UA: NONE SEEN
Bilirubin Urine: NEGATIVE
Glucose, UA: NEGATIVE mg/dL
Hgb urine dipstick: NEGATIVE
Ketones, ur: 5 mg/dL — AB
Leukocytes,Ua: NEGATIVE
Nitrite: NEGATIVE
Protein, ur: NEGATIVE mg/dL
Specific Gravity, Urine: 1.014 (ref 1.005–1.030)
pH: 5 (ref 5.0–8.0)

## 2019-04-22 LAB — CBC
HCT: 39.2 % (ref 36.0–46.0)
Hemoglobin: 14 g/dL (ref 12.0–15.0)
MCH: 30.2 pg (ref 26.0–34.0)
MCHC: 35.7 g/dL (ref 30.0–36.0)
MCV: 84.7 fL (ref 80.0–100.0)
Platelets: 288 10*3/uL (ref 150–400)
RBC: 4.63 MIL/uL (ref 3.87–5.11)
RDW: 11.2 % — ABNORMAL LOW (ref 11.5–15.5)
WBC: 8.9 10*3/uL (ref 4.0–10.5)
nRBC: 0 % (ref 0.0–0.2)

## 2019-04-22 LAB — POCT PREGNANCY, URINE
Preg Test, Ur: NEGATIVE
Preg Test, Ur: NEGATIVE

## 2019-04-22 NOTE — ED Triage Notes (Signed)
Pt reports a HA for a month and a half with no relief from otc medication. Pt denies blurred vision, or other issues. Pt states she was seen here for it a month ago and was told it was a UTI.

## 2019-04-23 ENCOUNTER — Emergency Department
Admission: EM | Admit: 2019-04-23 | Discharge: 2019-04-23 | Disposition: A | Discharge status: Home / Self Care | Payer: Self-pay | Attending: Emergency Medicine | Admitting: Emergency Medicine

## 2019-04-23 ENCOUNTER — Emergency Department: Payer: Self-pay

## 2019-04-23 ENCOUNTER — Encounter: Payer: Self-pay | Admitting: Family Medicine

## 2019-04-23 ENCOUNTER — Encounter: Payer: Self-pay | Admitting: Unknown Physician Specialty

## 2019-04-23 ENCOUNTER — Ambulatory Visit (INDEPENDENT_AMBULATORY_CARE_PROVIDER_SITE_OTHER): Payer: Self-pay | Admitting: Unknown Physician Specialty

## 2019-04-23 VITALS — BP 129/80 | HR 99 | Temp 98.5°F | Ht 61.5 in | Wt 133.0 lb

## 2019-04-23 DIAGNOSIS — Z3041 Encounter for surveillance of contraceptive pills: Secondary | ICD-10-CM

## 2019-04-23 DIAGNOSIS — G43709 Chronic migraine without aura, not intractable, without status migrainosus: Secondary | ICD-10-CM

## 2019-04-23 DIAGNOSIS — IMO0002 Reserved for concepts with insufficient information to code with codable children: Secondary | ICD-10-CM

## 2019-04-23 DIAGNOSIS — G44219 Episodic tension-type headache, not intractable: Secondary | ICD-10-CM

## 2019-04-23 MED ORDER — DEXAMETHASONE SODIUM PHOSPHATE 10 MG/ML IJ SOLN
10.0000 mg | Freq: Once | INTRAMUSCULAR | Status: AC
  Administered 2019-04-23: 10 mg via INTRAVENOUS
  Filled 2019-04-23: qty 1

## 2019-04-23 MED ORDER — NORGESTIMATE-ETH ESTRADIOL 0.25-35 MG-MCG PO TABS: 1.0000 | ORAL_TABLET | Freq: Every day | ORAL | 3 refills | Status: DC

## 2019-04-23 MED ORDER — KETOROLAC TROMETHAMINE 30 MG/ML IJ SOLN
15.0000 mg | Freq: Once | INTRAMUSCULAR | Status: AC
  Administered 2019-04-23: 03:00:00 15 mg via INTRAVENOUS
  Filled 2019-04-23: qty 1

## 2019-04-23 MED ORDER — NAPROXEN 500 MG PO TABS: 500.0000 mg | ORAL_TABLET | Freq: Two times a day (BID) | ORAL | 0 refills | Status: DC

## 2019-04-23 MED ORDER — SODIUM CHLORIDE 0.9 % IV BOLUS
500.0000 mL | Freq: Once | INTRAVENOUS | Status: AC
  Administered 2019-04-23: 03:00:00 500 mL via INTRAVENOUS

## 2019-04-23 MED ORDER — DROPERIDOL 2.5 MG/ML IJ SOLN
2.5000 mg | Freq: Once | INTRAMUSCULAR | Status: AC
  Administered 2019-04-23: 03:00:00 2.5 mg via INTRAVENOUS
  Filled 2019-04-23: qty 2

## 2019-04-23 MED ORDER — CLINDAMYCIN PHOSPHATE 2 % VA CREA: 1.0000 | TOPICAL_CREAM | Freq: Every day | VAGINAL | 0 refills | Status: DC

## 2019-04-23 NOTE — Discharge Instructions (Signed)

## 2019-04-23 NOTE — ED Provider Notes (Signed)
[facility] Emergency Department Provider Note  ____________________________________________   First MD Initiated Contact with Patient 04/23/19 0136     (approximate)  I have reviewed the triage vital signs and the nursing notes.   HISTORY  Chief Complaint Headache    HPI Allison Sanchez is a 23 y.o. female who presents for evaluation of a generalized headache that is worse in the back part of her head that has been present on and off for about 2 months.  She says that over-the-counter medication does not help.  She has not had any blurred vision, nausea, vomiting, fever/chills, neck stiffness, chest pain, shortness of breath, nor abdominal pain.  She was seen almost 2 months ago in the emergency department  for similar symptoms as well as reportedly passing out or almost passing out.  Her work-up was generally reassuring only with a mild UTI.  She said that the headache has continued.  In general it is not present when she wakes up and starts within 2 hours of waking up and is present for the course of the day.  Sometimes she feels lightheaded or dizzy or weak when it is going on and it seems to radiate from the back to the front.  Nothing in particular makes it better and activity seems to make it worse.  She has not been to see her primary care doctor.  She denies any other chronic medical issues.  She describes the pain as severe.        Past Medical History:  Diagnosis Date  . Asthma   . BV (bacterial vaginosis)   . History of twin pregnancy in prior pregnancy   . Premature delivery   . UTI (urinary tract infection)     Patient Active Problem List   Diagnosis Date Noted  . Left-sided chest wall pain 05/08/2017  . Gastroesophageal reflux disease with esophagitis 02/26/2014    Past Surgical History:  Procedure Laterality Date  . CESAREAN SECTION N/A 01/10/2016   Procedure: CESAREAN SECTION;  Surgeon: Catalina AntiguaPeggy Constant, MD;  [facility]: [facility];  Service: Obstetrics;  Laterality: N/A;    Prior to Admission medications   Medication Sig Start Date End Date Taking? Authorizing Provider  albuterol (PROVENTIL HFA;VENTOLIN HFA) 108 (90 Base) MCG/ACT inhaler Inhale 2 puffs into the lungs every 6 (six) hours as needed. 05/08/17   Gabriel CirriWicker, Cheryl, NP  cephALEXin (KEFLEX) 500 MG capsule Take 1 capsule (500 mg total) by mouth 3 (three) times daily. 02/27/19   Minna AntisPaduchowski, Kevin, MD  EPINEPHrine 0.3 mg/0.3 mL IJ SOAJ injection  09/26/17   [provider]  JUNEL FE 1/20 1-20 MG-MCG tablet TAKE 1 TABLET BY MOUTH EVERY DAY 05/22/18   Aura Dialsannady, Jolene T, NP    Allergies Shellfish allergy  Family History  Problem Relation Age of Onset  . Hypertension Mother   . Sickle cell trait Mother   . Hypertension Daughter        pulmonary  . Cancer Paternal Uncle   . Gout Maternal Grandfather   . Cancer Paternal Uncle   . Cancer Paternal Uncle   . Bladder Cancer Neg Hx   . Kidney cancer Neg Hx     Social History Social History   Tobacco Use  . Smoking status: Former Smoker    Types: Cigarettes  . Smokeless tobacco: Never Used  Substance Use Topics  . Alcohol use: No  . Drug use: No    Review of Systems Constitutional: No fever/chills Eyes: No  visual changes. ENT: No sore throat. Cardiovascular: Denies chest pain. Respiratory: Denies shortness of breath. Gastrointestinal: No abdominal pain.  No nausea, no vomiting.  No diarrhea.  No constipation. Genitourinary: Negative for dysuria. Musculoskeletal: Negative for neck pain.  Negative for back pain. Integumentary: Negative for rash. Neurological: Persistent generalized headache that tends to go from the back to the front, starts within a few hours of getting up and lasts all day, chronic for 2+ months.   ____________________________________________   PHYSICAL EXAM:  VITAL SIGNS: ED Triage Vitals  Enc Vitals Group     BP 04/23/19 0058 109/64     Pulse Rate 04/23/19  0058 63     Resp 04/23/19 0058 18     Temp 04/23/19 0058 98.1 F (36.7 C)     Temp Source 04/23/19 0058 Oral     SpO2 04/23/19 0058 100 %     Weight 04/22/19 1748 62.1 kg (137 lb)     Height 04/22/19 1748 1.549 m (5\' 1" )     Head Circumference --      Peak Flow --      Pain Score 04/22/19 1748 8     Pain Loc --      Pain Edu? --      Excl. in [place]? --     Constitutional: Alert and oriented.  Generally well-appearing. Eyes: Conjunctivae are normal.  Pupils are equal and reactive bilaterally.  No nystagmus.  Normal accommodation. Head: Atraumatic. Nose: No congestion/rhinnorhea. Mouth/Throat: Patient is wearing a mask. Neck: No stridor.  No meningeal signs.   Cardiovascular: Normal rate, regular rhythm. Good peripheral circulation. Grossly normal heart sounds. Respiratory: Normal respiratory effort.  No retractions. Gastrointestinal: Soft and nontender. No distention.  Musculoskeletal: No lower extremity tenderness nor edema. No gross deformities of extremities. Neurologic:  Normal speech and language. No gross focal neurologic deficits are appreciated.  Good grip strength in major muscle group strength throughout upper and lower extremities.  No cranial nerve deficits appreciated. Skin:  Skin is warm, dry and intact. Psychiatric: Mood and affect are normal. Speech and behavior are normal.  ____________________________________________   LABS (all labs ordered are listed, but only abnormal results are displayed)  Labs Reviewed  COMPREHENSIVE METABOLIC PANEL - Abnormal; Notable for the following components:      Result Value   Total Protein 8.2 (*)    All other components within normal limits  CBC - Abnormal; Notable for the following components:   RDW 11.2 (*)    All other components within normal limits  URINALYSIS, COMPLETE (UACMP) WITH MICROSCOPIC - Abnormal; Notable for the following components:   Color, Urine YELLOW (*)    APPearance HAZY (*)    Ketones, ur 5 (*)    All  other components within normal limits  POCT PREGNANCY, URINE  POCT PREGNANCY, URINE   ____________________________________________  EKG  No indication for EKG.  Old EKG demonstrates normal QTc interval. ____________________________________________  RADIOLOGY I, Hinda Kehr, personally viewed and evaluated these images (plain radiographs) as part of my medical decision making, as well as reviewing the written report by the radiologist.  ED MD interpretation:  No acute/emergent abnormalities identified.  Official radiology report(s): CT Head Wo Contrast  Result Date: 04/23/2019 CLINICAL DATA:  Headache, chronic, no new features Patient reports headache for a month and a half. EXAM: CT HEAD WITHOUT CONTRAST TECHNIQUE: Contiguous axial images were obtained from the base of the skull through the vertex without intravenous contrast. COMPARISON:  None. FINDINGS: Brain: No evidence  of acute infarction, hemorrhage, hydrocephalus, extra-axial collection or mass lesion/mass effect. Vascular: No hyperdense vessel or unexpected calcification. Skull: Normal. Negative for fracture or focal lesion. Sinuses/Orbits: Paranasal sinuses and mastoid air cells are clear. The visualized orbits are unremarkable. Other: None. IMPRESSION: Unremarkable noncontrast head CT. Electronically Signed   By: Narda Rutherford M.D.   On: 04/23/2019 02:14    ____________________________________________   PROCEDURES   Procedure(s) performed (including Critical Care):  Procedures   ____________________________________________   INITIAL IMPRESSION / MDM / ASSESSMENT AND PLAN / ED COURSE  As part of my medical decision making, I reviewed the following data within the electronic MEDICAL RECORD NUMBER Nursing notes reviewed and incorporated, Labs reviewed , Old chart reviewed, Notes from prior ED visits and [place] Controlled Substance Database   Differential diagnosis includes, but is not limited to, chronic migraine, tension  headache, cluster headache, other nonspecific headache, neoplasm, idiopathic intracranial hypertension, stress/anxiety, electrolyte or metabolic abnormality.  CNS infection is very unlikely.  The patient's symptoms are chronic but I see no evidence in the computer system that she has ever had imaging of her head.  Given that she has had a headache which may be worsening over the last 2 months, I think it is reasonable to get a Noncon head CT to look for any evidence of intracranial abnormality.  Her lab work today is reassuring with no evidence of infection or electrolyte/metabolic abnormality.  I discussed with the patient the need to follow-up as an outpatient given that these are nonacute symptoms and I am unlikely to diagnose them tonight beyond the fact that I think it is most likely she is suffering from migraines.  I will treat her with a "migraine cocktail" tonight that includes the following: Droperidol 2.5 mg IV, dexamethasone 10 mg IV, ketorolac 15 mg IV, and 500 mL normal saline IV bolus.  She understands and agrees with the plan.       Clinical Course as of Apr 23 427  Tue Apr 23, 2019  0303 Normal head CT.  Nurse had multiple critical patients requiring care, but is now starting IV to administer meds and fluids.  Anticipate discharge.  CT Head Wo Contrast [CF]    Clinical Course User Index [CF] Loleta Rose, MD     ____________________________________________  FINAL CLINICAL IMPRESSION(S) / ED DIAGNOSES  Final diagnoses:  Chronic migraine     MEDICATIONS GIVEN DURING THIS VISIT:  Medications  droperidol (INAPSINE) 2.5 MG/ML injection 2.5 mg (2.5 mg Intravenous Given 04/23/19 0329)  dexamethasone (DECADRON) injection 10 mg (10 mg Intravenous Given 04/23/19 0326)  sodium chloride 0.9 % bolus 500 mL (500 mLs Intravenous New Bag/Given 04/23/19 0325)  ketorolac (TORADOL) 30 MG/ML injection 15 mg (15 mg Intravenous Given 04/23/19 0329)     ED Discharge Orders    None       *Please note:  Allison Sanchez was evaluated in Emergency Department on 04/23/2019 for the symptoms described in the history of present illness. She was evaluated in the context of the global COVID-19 pandemic, which necessitated consideration that the patient might be at risk for infection with the SARS-CoV-2 virus that causes COVID-19. Institutional protocols and algorithms that pertain to the evaluation of patients at risk for COVID-19 are in a state of rapid change based on information released by regulatory bodies including the CDC and federal and state organizations. These policies and algorithms were followed during the patient's care in the ED.  Some ED evaluations and interventions may be delayed  as a result of limited staffing during the pandemic.*  Note:  This document was prepared using Dragon voice recognition software and may include unintentional dictation errors.   Loleta Rose, MD 04/23/19 [id number]

## 2019-04-23 NOTE — ED Notes (Signed)
Reviewed discharge instructions and follow-up care with patient. Patient verbalized understanding of all information reviewed. Patient stable, with no distress noted at this time.    

## 2019-04-23 NOTE — ED Notes (Signed)
Pt sitting in lobby with no distress noted; updated on wait time and vs retaken 

## 2019-04-23 NOTE — Progress Notes (Signed)
BP 129/80 (BP Location: Left Arm, Patient Position: Sitting, Cuff Size: Normal)   Pulse 99   Temp 98.5 F (36.9 C) (Oral)   Ht 5' 1.5" (1.562 m)   Wt 133 lb (60.3 kg)   LMP 04/19/2019 (Exact Date)   SpO2 98%   BMI 24.72 kg/m    Subjective:    Patient ID: Allison Sanchez, female    DOB: 1995-08-21, 23 y.o.   MRN: [id number]  HPI: Allison Sanchez is a 23 y.o. female  Chief Complaint  Patient presents with  . Headache    Pt states she started having headaches in late Oct, early Nov two weeks straight. had a UTI. attibiotics seemed to make hedaches disappear then they returned. Pt states headaches are very pressure like in the back of her head with an occassional sharp pain and cause her to become dizzy..  . Contraception    Pt would like to discuss with restarting prev contraception or beginning a new one.    F/u from the ER yesterday.   Headaches- Pt states she has had headaches for about 6 weeks.  States it comes and goes, sometimes daily for 2-3 weeks.  Headaches start 2-3 hours after awakening.  No nausea or vomiting but does have dizzyness- which is more like drifting off which is followed by panic attacks. She has been to the ER twice for these headaches.  CT without contrast was negative.   Sleeps well and headaches don't wake her up in the middle of the night.  She has taken Advil or Tylenol without effect.    States she is always in a down mood related to the headaches.     CT was negative  She did stop smoking 5 months ago and stopped vaping 3 months ago  Depression screen [facility] 2/9 04/23/2019 05/04/2017  Decreased Interest 1 0  Down, Depressed, Hopeless 1 0  PHQ - 2 Score 2 0  Altered sleeping 0 -  Tired, decreased energy 1 -  Change in appetite 0 -  Feeling bad or failure about yourself  1 -  Trouble concentrating 1 -  Moving slowly or fidgety/restless 0 -  Suicidal thoughts 0 -  PHQ-9 Score 5 -   Contraception She had been on birth control pills and would like  to restart.  She has monthly periods which lasts 5-7 days.  LMP last week  Relevant past medical, surgical, family and social history reviewed and updated as indicated. Interim medical history since our last visit reviewed. Allergies and medications reviewed and updated.  Review of Systems  Genitourinary: Positive for vaginal discharge.       With odor    Per HPI unless specifically indicated above     Objective:    BP 129/80 (BP Location: Left Arm, Patient Position: Sitting, Cuff Size: Normal)   Pulse 99   Temp 98.5 F (36.9 C) (Oral)   Ht 5' 1.5" (1.562 m)   Wt 133 lb (60.3 kg)   LMP 04/19/2019 (Exact Date)   SpO2 98%   BMI 24.72 kg/m   Wt Readings from Last 3 Encounters:  04/23/19 133 lb (60.3 kg)  04/22/19 137 lb (62.1 kg)  02/27/19 138 lb (62.6 kg)    Physical Exam Constitutional:      Appearance: She is well-developed.  HENT:     Head: Normocephalic and atraumatic.  Eyes:     General: No scleral icterus.       Right eye: No discharge.  Left eye: No discharge.     Pupils: Pupils are equal, round, and reactive to light.  Neck:     Thyroid: No thyromegaly.     Vascular: No carotid bruit.  Cardiovascular:     Rate and Rhythm: Normal rate and regular rhythm.     Heart sounds: Normal heart sounds. No murmur. No friction rub. No gallop.   Pulmonary:     Effort: Pulmonary effort is normal. No respiratory distress.     Breath sounds: Normal breath sounds. No wheezing or rales.  Abdominal:     General: Bowel sounds are normal.     Palpations: Abdomen is soft.     Tenderness: There is no abdominal tenderness. There is no rebound.  Musculoskeletal:        General: Normal range of motion.     Cervical back: Normal range of motion and neck supple.  Lymphadenopathy:     Cervical: No cervical adenopathy.  Skin:    General: Skin is warm and dry.     Findings: No rash.  Neurological:     Mental Status: She is alert and oriented to person, place, and time.      Cranial Nerves: No cranial nerve deficit or facial asymmetry.     Motor: No weakness.     Coordination: Romberg sign negative.     Gait: Gait normal.     Deep Tendon Reflexes: Reflexes normal.  Psychiatric:        Speech: Speech normal.        Behavior: Behavior normal.        Thought Content: Thought content normal.        Judgment: Judgment normal.     Results for orders placed or performed during the hospital encounter of 04/23/19  Comprehensive metabolic panel  Result Value Ref Range   Sodium 137 135 - 145 mmol/L   Potassium 3.9 3.5 - 5.1 mmol/L   Chloride 104 98 - 111 mmol/L   CO2 25 22 - 32 mmol/L   Glucose, Bld 91 70 - 99 mg/dL   BUN 13 6 - 20 mg/dL   Creatinine, Ser 8.54 0.44 - 1.00 mg/dL   Calcium 9.4 8.9 - 62.7 mg/dL   Total Protein 8.2 (H) 6.5 - 8.1 g/dL   Albumin 4.4 3.5 - 5.0 g/dL   AST 17 15 - 41 U/L   ALT 12 0 - 44 U/L   Alkaline Phosphatase 81 38 - 126 U/L   Total Bilirubin 0.6 0.3 - 1.2 mg/dL   GFR calc non Af Amer >60 >60 mL/min   GFR calc Af Amer >60 >60 mL/min   Anion gap 8 5 - 15  CBC  Result Value Ref Range   WBC 8.9 4.0 - 10.5 K/uL   RBC 4.63 3.87 - 5.11 MIL/uL   Hemoglobin 14.0 12.0 - 15.0 g/dL   HCT 03.5 00.9 - 38.1 %   MCV 84.7 80.0 - 100.0 fL   MCH 30.2 26.0 - 34.0 pg   MCHC 35.7 30.0 - 36.0 g/dL   RDW 82.9 (L) 93.7 - 16.9 %   Platelets 288 150 - 400 K/uL   nRBC 0.0 0.0 - 0.2 %  Urinalysis, Complete w Microscopic  Result Value Ref Range   Color, Urine YELLOW (A) YELLOW   APPearance HAZY (A) CLEAR   Specific Gravity, Urine 1.014 1.005 - 1.030   pH 5.0 5.0 - 8.0   Glucose, UA NEGATIVE NEGATIVE mg/dL   Hgb urine dipstick NEGATIVE NEGATIVE  Bilirubin Urine NEGATIVE NEGATIVE   Ketones, ur 5 (A) NEGATIVE mg/dL   Protein, ur NEGATIVE NEGATIVE mg/dL   Nitrite NEGATIVE NEGATIVE   Leukocytes,Ua NEGATIVE NEGATIVE   RBC / HPF 0-5 0 - 5 RBC/hpf   WBC, UA 0-5 0 - 5 WBC/hpf   Bacteria, UA NONE SEEN NONE SEEN   Squamous Epithelial / LPF 6-10 0  - 5   Mucus PRESENT   Pregnancy, urine POC  Result Value Ref Range   Preg Test, Ur NEGATIVE NEGATIVE  Pregnancy, urine POC  Result Value Ref Range   Preg Test, Ur NEGATIVE NEGATIVE      Assessment & Plan:   Problem List Items Addressed This Visit    None    Visit Diagnoses    Episodic tension-type headache, not intractable    -  Primary   Will try Naprosyn BID to determine if it keeps headaches from starting. Recheck back in 2 weeks   Relevant Medications   naproxen (NAPROSYN) 500 MG tablet   Encounter for surveillance of contraceptive pills       Rx for OCPs.  Headache not migraine with auro.          Follow up plan: Return in about 2 weeks (around 05/07/2019), or if symptoms worsen or fail to improve.  Addendum: Pt feels she has BV- unable to integrate in today's visit  Will treat and schedule PE

## 2019-05-01 ENCOUNTER — Telehealth: Payer: Self-pay | Admitting: Family Medicine

## 2019-05-01 MED ORDER — SUMATRIPTAN SUCCINATE 50 MG PO TABS: ORAL_TABLET | ORAL | 0 refills | Status: DC

## 2019-05-01 NOTE — Telephone Encounter (Signed)
Patient notified and verbalized understanding. 

## 2019-05-01 NOTE — Telephone Encounter (Signed)
Pt called and stated that she was seen in the office for headaches on 04/24/19. Pt states that she was prescribed naproxen (NAPROSYN) 500 MG tablet [[id number]]. This medication seems to only take the edge off. Pt would like to know if something stronger be called in. Please advise

## 2019-05-01 NOTE — Addendum Note (Signed)
Addended by: Roosvelt Maser E on: 05/01/2019 12:03 PM   Modules accepted: Orders

## 2019-05-01 NOTE — Telephone Encounter (Signed)
Difficult to determine per chart review whether her headaches are more tension related or migrainous - can try imitrex, tylenol, neck massage, heat to see if these things relieve her pain. OK to move her f/u to sooner if desired. Will send over the imitrex with instructions for use on bottle

## 2019-05-08 ENCOUNTER — Other Ambulatory Visit: Payer: Self-pay

## 2019-05-08 ENCOUNTER — Ambulatory Visit (INDEPENDENT_AMBULATORY_CARE_PROVIDER_SITE_OTHER): Payer: Self-pay | Admitting: Unknown Physician Specialty

## 2019-05-08 ENCOUNTER — Encounter: Payer: Self-pay | Admitting: Unknown Physician Specialty

## 2019-05-08 DIAGNOSIS — G44219 Episodic tension-type headache, not intractable: Secondary | ICD-10-CM

## 2019-05-08 NOTE — Progress Notes (Signed)
LMP 04/19/2019 (Exact Date)    Subjective:    Patient ID: Allison Sanchez, female    DOB: November 11, 1995, 25 y.o.   MRN: [id number]  HPI: Allison Sanchez is a 24 y.o. female  Chief Complaint  Patient presents with  . Migraine    follow up   Pt is here for f/u of chronic daily headaches.  Pt is following up on chronic daily headaches.  States the Naprosyn helped a lot but did not "knock it out." States she wanted some more improvement, and was given Imitrex.  States she never took the Imitrex as pain magically disappeared.    Relevant past medical, surgical, family and social history reviewed and updated as indicated. Interim medical history since our last visit reviewed. Allergies and medications reviewed and updated.  Review of Systems  Per HPI unless specifically indicated above     Objective:    LMP 04/19/2019 (Exact Date)   Wt Readings from Last 3 Encounters:  04/23/19 133 lb (60.3 kg)  04/22/19 137 lb (62.1 kg)  02/27/19 138 lb (62.6 kg)    Physical Exam Constitutional:      General: She is not in acute distress.    Appearance: Normal appearance. She is well-developed.  HENT:     Head: Normocephalic and atraumatic.     Nose: No rhinorrhea.     Right Sinus: No maxillary sinus tenderness or frontal sinus tenderness.     Left Sinus: No maxillary sinus tenderness or frontal sinus tenderness.     Mouth/Throat:     Mouth: Mucous membranes are moist.  Eyes:     General: Lids are normal. No scleral icterus.       Right eye: No discharge.        Left eye: No discharge.     Conjunctiva/sclera: Conjunctivae normal.  Pulmonary:     Effort: Pulmonary effort is normal. No respiratory distress.  Abdominal:     Palpations: There is no hepatomegaly or splenomegaly.  Musculoskeletal:        General: Normal range of motion.  Skin:    Coloration: Skin is not pale.     Findings: No erythema or rash.  Neurological:     Mental Status: She is alert and oriented to person, place,  and time.  Psychiatric:        Behavior: Behavior normal.        Thought Content: Thought content normal.        Judgment: Judgment normal.     Results for orders placed or performed during the hospital encounter of 04/23/19  Comprehensive metabolic panel  Result Value Ref Range   Sodium 137 135 - 145 mmol/L   Potassium 3.9 3.5 - 5.1 mmol/L   Chloride 104 98 - 111 mmol/L   CO2 25 22 - 32 mmol/L   Glucose, Bld 91 70 - 99 mg/dL   BUN 13 6 - 20 mg/dL   Creatinine, Ser 9.62 0.44 - 1.00 mg/dL   Calcium 9.4 8.9 - 22.9 mg/dL   Total Protein 8.2 (H) 6.5 - 8.1 g/dL   Albumin 4.4 3.5 - 5.0 g/dL   AST 17 15 - 41 U/L   ALT 12 0 - 44 U/L   Alkaline Phosphatase 81 38 - 126 U/L   Total Bilirubin 0.6 0.3 - 1.2 mg/dL   GFR calc non Af Amer >60 >60 mL/min   GFR calc Af Amer >60 >60 mL/min   Anion gap 8 5 - 15  CBC  Result Value Ref Range   WBC 8.9 4.0 - 10.5 K/uL   RBC 4.63 3.87 - 5.11 MIL/uL   Hemoglobin 14.0 12.0 - 15.0 g/dL   HCT 39.2 36.0 - 46.0 %   MCV 84.7 80.0 - 100.0 fL   MCH 30.2 26.0 - 34.0 pg   MCHC 35.7 30.0 - 36.0 g/dL   RDW 11.2 (L) 11.5 - 15.5 %   Platelets 288 150 - 400 K/uL   nRBC 0.0 0.0 - 0.2 %  Urinalysis, Complete w Microscopic  Result Value Ref Range   Color, Urine YELLOW (A) YELLOW   APPearance HAZY (A) CLEAR   Specific Gravity, Urine 1.014 1.005 - 1.030   pH 5.0 5.0 - 8.0   Glucose, UA NEGATIVE NEGATIVE mg/dL   Hgb urine dipstick NEGATIVE NEGATIVE   Bilirubin Urine NEGATIVE NEGATIVE   Ketones, ur 5 (A) NEGATIVE mg/dL   Protein, ur NEGATIVE NEGATIVE mg/dL   Nitrite NEGATIVE NEGATIVE   Leukocytes,Ua NEGATIVE NEGATIVE   RBC / HPF 0-5 0 - 5 RBC/hpf   WBC, UA 0-5 0 - 5 WBC/hpf   Bacteria, UA NONE SEEN NONE SEEN   Squamous Epithelial / LPF 6-10 0 - 5   Mucus PRESENT   Pregnancy, urine POC  Result Value Ref Range   Preg Test, Ur NEGATIVE NEGATIVE  Pregnancy, urine POC  Result Value Ref Range   Preg Test, Ur NEGATIVE NEGATIVE      Assessment & Plan:    Problem List Items Addressed This Visit    None    Visit Diagnoses    Episodic tension-type headache, not intractable    -  Primary   Resolved after taking Naprosyn for about 2 weeks.  Pt ed on continuing with prn use of Naprosyn.  Encouraged stretching.        Follow up plan: Return if symptoms worsen or fail to improve.

## 2019-05-16 ENCOUNTER — Other Ambulatory Visit: Payer: Self-pay | Admitting: Unknown Physician Specialty

## 2019-05-16 NOTE — Telephone Encounter (Signed)
Forwarding medication refill request to PCP for review. 

## 2019-05-24 ENCOUNTER — Other Ambulatory Visit: Payer: Self-pay | Admitting: Family Medicine

## 2019-06-14 ENCOUNTER — Encounter: Payer: Self-pay | Admitting: Nurse Practitioner

## 2019-06-14 ENCOUNTER — Other Ambulatory Visit: Payer: Self-pay

## 2019-06-14 ENCOUNTER — Ambulatory Visit (INDEPENDENT_AMBULATORY_CARE_PROVIDER_SITE_OTHER): Payer: Self-pay | Admitting: Nurse Practitioner

## 2019-06-14 VITALS — BP 93/61 | HR 67 | Temp 98.0°F | Ht 61.5 in | Wt 131.0 lb

## 2019-06-14 DIAGNOSIS — R399 Unspecified symptoms and signs involving the genitourinary system: Secondary | ICD-10-CM

## 2019-06-14 DIAGNOSIS — R82998 Other abnormal findings in urine: Secondary | ICD-10-CM

## 2019-06-14 MED ORDER — NITROFURANTOIN MONOHYD MACRO 100 MG PO CAPS: 100.0000 mg | ORAL_CAPSULE | Freq: Two times a day (BID) | ORAL | 0 refills | Status: AC

## 2019-06-14 MED ORDER — PHENAZOPYRIDINE HCL 97.2 MG PO TABS: 97.0000 mg | ORAL_TABLET | Freq: Three times a day (TID) | ORAL | 0 refills | Status: DC | PRN

## 2019-06-14 NOTE — Progress Notes (Signed)
BP 93/61 (BP Location: Left Arm, Patient Position: Sitting, Cuff Size: Normal)   Pulse 67   Temp 98 F (36.7 C) (Oral)   Ht 5' 1.5" (1.562 m)   Wt 131 lb (59.4 kg)   SpO2 98%   BMI 24.35 kg/m    Subjective:    Patient ID: Allison Sanchez, female    DOB: 12-Mar-1996, 24 y.o.   MRN: [id number]  HPI: Allison Sanchez is a 24 y.o. female  Chief Complaint  Patient presents with  . Dysuria    Ongoing appx 2 days   URINARY SYMPTOMS Onset: 2 days ago Dysuria: yes Urinary frequency: yes Urgency: yes Small volume voids: yes Symptom severity: mild Urinary incontinence: no Foul odor: no Hematuria: no Abdominal pain: no Back pain: no Suprapubic pain/pressure: no Flank pain: no Fever:  No Diarrhea: no Nausea/Vomiting: no Relief with cranberry juice: not tried Relief with pyridium: not tried Status: stable Previous urinary tract infection: yes Recurrent urinary tract infection: no Sexual activity: monogamous; urinates after sexual activity History of sexually transmitted disease: no Treatments attempted: increasing fluids    Reports has seen Urologist for issues with bladder pain and negative urine cultures in past but was never formally diagnosed with anything.  Allergies  Allergen Reactions  . Shellfish Allergy Swelling   Outpatient Encounter Medications as of 06/14/2019  Medication Sig  . albuterol (PROVENTIL HFA;VENTOLIN HFA) 108 (90 Base) MCG/ACT inhaler Inhale 2 puffs into the lungs every 6 (six) hours as needed.  Marland Kitchen EPINEPHrine 0.3 mg/0.3 mL IJ SOAJ injection   . naproxen (NAPROSYN) 500 MG tablet TAKE 1 TABLET (500 MG TOTAL) BY MOUTH 2 (TWO) TIMES DAILY WITH A MEAL.  . norgestimate-ethinyl estradiol (SPRINTEC 28) 0.25-35 MG-MCG tablet Take 1 tablet by mouth daily.  . SUMAtriptan (IMITREX) 50 MG tablet TAKE 1 TAB AT ONSET OF MIGRAINE. MAY REPEAT IN 2 HOURS IF HEADACHE PERSISTS OR RECURS. MAX OF 2 TABS IN A DAY  . nitrofurantoin, macrocrystal-monohydrate, (MACROBID)  100 MG capsule Take 1 capsule (100 mg total) by mouth 2 (two) times daily for 7 days.  . phenazopyridine (PYRIDIUM) 97 MG tablet Take 1 tablet (97 mg total) by mouth 3 (three) times daily as needed for pain.  . [DISCONTINUED] clindamycin (CLEOCIN) 2 % vaginal cream Place 1 Applicatorful vaginally at bedtime. (Patient not taking: Reported on 06/14/2019)   No facility-administered encounter medications on file as of 06/14/2019.   Patient Active Problem List   Diagnosis Date Noted  . UTI symptoms 06/14/2019  . Left-sided chest wall pain 05/08/2017  . Gastroesophageal reflux disease with esophagitis 02/26/2014   Past Medical History:  Diagnosis Date  . Asthma   . BV (bacterial vaginosis)   . History of twin pregnancy in prior pregnancy   . Premature delivery   . UTI (urinary tract infection)    Review of Systems  Constitutional: Negative.  Negative for activity change, chills, fatigue and fever.  Respiratory: Negative.  Negative for shortness of breath.   Cardiovascular: Negative.  Negative for chest pain.  Gastrointestinal: Negative.  Negative for abdominal pain, diarrhea, nausea and vomiting.  Genitourinary: Positive for decreased urine volume, dysuria, frequency and urgency. Negative for difficulty urinating, enuresis, flank pain, hematuria and pelvic pain.  Musculoskeletal: Negative.  Negative for back pain.  Neurological: Negative.   Psychiatric/Behavioral: Negative.     Per HPI unless specifically indicated above     Objective:    BP 93/61 (BP Location: Left Arm, Patient Position: Sitting, Cuff Size: Normal)  Pulse 67   Temp 98 F (36.7 C) (Oral)   Ht 5' 1.5" (1.562 m)   Wt 131 lb (59.4 kg)   SpO2 98%   BMI 24.35 kg/m   Wt Readings from Last 3 Encounters:  06/14/19 131 lb (59.4 kg)  04/23/19 133 lb (60.3 kg)  04/22/19 137 lb (62.1 kg)    Physical Exam Vitals and nursing note reviewed.  Constitutional:      General: She is not in acute distress.    Appearance:  Normal appearance. She is normal weight. She is not toxic-appearing.  Cardiovascular:     Rate and Rhythm: Normal rate and regular rhythm.     Pulses: Normal pulses.     Heart sounds: Normal heart sounds. No murmur.  Pulmonary:     Effort: Pulmonary effort is normal. No respiratory distress.     Breath sounds: Normal breath sounds. No wheezing or rhonchi.  Abdominal:     General: Abdomen is flat. Bowel sounds are normal. There is no distension.     Tenderness: There is no abdominal tenderness. There is no right CVA tenderness or left CVA tenderness.  Musculoskeletal:        General: No swelling or tenderness. Normal range of motion.  Neurological:     General: No focal deficit present.     Mental Status: She is alert and oriented to person, place, and time.     Motor: No weakness.     Gait: Gait normal.  Psychiatric:        Mood and Affect: Mood normal.        Behavior: Behavior normal.        Thought Content: Thought content normal.        Judgment: Judgment normal.       Assessment & Plan:   Problem List Items Addressed This Visit      Other   UTI symptoms - Primary    Acute, ongoing.  UA in office showed many WBC and some blood, will send for culture.  Given history of UTI and most recent urine culture showed E. Coli sensitive to nitrofurantoin, will treat with nitrofurantoin bid x7 days.  Rx also written for phenazopyridine for urinary pain.  Educated on urinary hygiene.  Advised patient to call back if not feeling better by Monday.  With sudden onset back pain, nausea/vomiting and unable to keep fluids down, go straight to ED.        Relevant Medications   nitrofurantoin, macrocrystal-monohydrate, (MACROBID) 100 MG capsule   phenazopyridine (PYRIDIUM) 97 MG tablet   Other Relevant Orders   UA/M w/rflx Culture, Routine    Other Visit Diagnoses    Urine WBC increased           Follow up plan: Return if symptoms worsen or fail to improve.

## 2019-06-14 NOTE — Patient Instructions (Signed)
Nice meeting you today! We will call you with your urine culture results on Monday.  For you to look into: Pyridium Uqora  Urinary Tract Infection, Adult  A urinary tract infection (UTI) is an infection of any part of the urinary tract. The urinary tract includes the kidneys, ureters, bladder, and urethra. These organs make, store, and get rid of urine in the body. Your health care provider may use other names to describe the infection. An upper UTI affects the ureters and kidneys (pyelonephritis). A lower UTI affects the bladder (cystitis) and urethra (urethritis). What are the causes? Most urinary tract infections are caused by bacteria in your genital area, around the entrance to your urinary tract (urethra). These bacteria grow and cause inflammation of your urinary tract. What increases the risk? You are more likely to develop this condition if:  You have a urinary catheter that stays in place (indwelling).  You are not able to control when you urinate or have a bowel movement (you have incontinence).  You are female and you: ? Use a spermicide or diaphragm for birth control. ? Have low estrogen levels. ? Are pregnant.  You have certain genes that increase your risk (genetics).  You are sexually active.  You take antibiotic medicines.  You have a condition that causes your flow of urine to slow down, such as: ? An enlarged prostate, if you are female. ? Blockage in your urethra (stricture). ? A kidney stone. ? A nerve condition that affects your bladder control (neurogenic bladder). ? Not getting enough to drink, or not urinating often.  You have certain medical conditions, such as: ? Diabetes. ? A weak disease-fighting system (immunesystem). ? Sickle cell disease. ? Gout. ? Spinal cord injury. What are the signs or symptoms? Symptoms of this condition include:  Needing to urinate right away (urgently).  Frequent urination or passing small amounts of urine  frequently.  Pain or burning with urination.  Blood in the urine.  Urine that smells bad or unusual.  Trouble urinating.  Cloudy urine.  Vaginal discharge, if you are female.  Pain in the abdomen or the lower back. You may also have:  Vomiting or a decreased appetite.  Confusion.  Irritability or tiredness.  A fever.  Diarrhea. The first symptom in older adults may be confusion. In some cases, they may not have any symptoms until the infection has worsened. How is this diagnosed? This condition is diagnosed based on your medical history and a physical exam. You may also have other tests, including:  Urine tests.  Blood tests.  Tests for sexually transmitted infections (STIs). If you have had more than one UTI, a cystoscopy or imaging studies may be done to determine the cause of the infections. How is this treated? Treatment for this condition includes:  Antibiotic medicine.  Over-the-counter medicines to treat discomfort.  Drinking enough water to stay hydrated. If you have frequent infections or have other conditions such as a kidney stone, you may need to see a health care provider who specializes in the urinary tract (urologist). In rare cases, urinary tract infections can cause sepsis. Sepsis is a life-threatening condition that occurs when the body responds to an infection. Sepsis is treated in the hospital with IV antibiotics, fluids, and other medicines. Follow these instructions at home:  Medicines  Take over-the-counter and prescription medicines only as told by your health care provider.  If you were prescribed an antibiotic medicine, take it as told by your health care provider. Do  not stop using the antibiotic even if you start to feel better. General instructions  Make sure you: ? Empty your bladder often and completely. Do not hold urine for long periods of time. ? Empty your bladder after sex. ? Wipe from front to back after a bowel movement  if you are female. Use each tissue one time when you wipe.  Drink enough fluid to keep your urine pale yellow.  Keep all follow-up visits as told by your health care provider. This is important. Contact a health care provider if:  Your symptoms do not get better after 1-2 days.  Your symptoms go away and then return. Get help right away if you have:  Severe pain in your back or your lower abdomen.  A fever.  Nausea or vomiting. Summary  A urinary tract infection (UTI) is an infection of any part of the urinary tract, which includes the kidneys, ureters, bladder, and urethra.  Most urinary tract infections are caused by bacteria in your genital area, around the entrance to your urinary tract (urethra).  Treatment for this condition often includes antibiotic medicines.  If you were prescribed an antibiotic medicine, take it as told by your health care provider. Do not stop using the antibiotic even if you start to feel better.  Keep all follow-up visits as told by your health care provider. This is important. This information is not intended to replace advice given to you by your health care provider. Make sure you discuss any questions you have with your health care provider. Document Revised: 03/29/2018 Document Reviewed: 10/19/2017 Elsevier Patient Education  2020 [employer].

## 2019-06-14 NOTE — Assessment & Plan Note (Addendum)
Acute, ongoing.  UA in office showed many WBC and some blood, will send for culture.  Given history of UTI and most recent urine culture showed E. Coli sensitive to nitrofurantoin, will treat with nitrofurantoin bid x7 days.  Rx also written for phenazopyridine for urinary pain.  Educated on urinary hygiene.  Advised patient to call back if not feeling better by Monday.  With sudden onset back pain, nausea/vomiting and unable to keep fluids down, go straight to ED.

## 2019-06-18 LAB — URINE CULTURE, REFLEX

## 2019-06-18 LAB — UA/M W/RFLX CULTURE, ROUTINE
Bilirubin, UA: NEGATIVE
Glucose, UA: NEGATIVE
Ketones, UA: NEGATIVE
Nitrite, UA: NEGATIVE
Protein,UA: NEGATIVE
Specific Gravity, UA: 1.02 (ref 1.005–1.030)
Urobilinogen, Ur: 0.2 mg/dL (ref 0.2–1.0)
pH, UA: 5 (ref 5.0–7.5)

## 2019-06-18 LAB — MICROSCOPIC EXAMINATION

## 2019-06-28 ENCOUNTER — Other Ambulatory Visit: Payer: Self-pay | Admitting: Unknown Physician Specialty

## 2019-06-28 MED ORDER — NAPROXEN 500 MG PO TABS: 500.0000 mg | ORAL_TABLET | Freq: Two times a day (BID) | ORAL | 1 refills | Status: DC

## 2019-06-28 NOTE — Telephone Encounter (Signed)
Previous Allison Sanchez patient. Seen by her in December 2020 and January 2021.

## 2019-07-17 ENCOUNTER — Encounter: Payer: Self-pay | Admitting: Unknown Physician Specialty

## 2019-07-18 ENCOUNTER — Encounter: Payer: Self-pay | Admitting: Nurse Practitioner

## 2019-07-18 ENCOUNTER — Other Ambulatory Visit: Payer: Self-pay

## 2019-07-18 ENCOUNTER — Telehealth (INDEPENDENT_AMBULATORY_CARE_PROVIDER_SITE_OTHER): Payer: Self-pay | Admitting: Nurse Practitioner

## 2019-07-18 VITALS — BP 111/74 | HR 72 | Temp 99.4°F

## 2019-07-18 DIAGNOSIS — R300 Dysuria: Secondary | ICD-10-CM

## 2019-07-18 DIAGNOSIS — B9689 Other specified bacterial agents as the cause of diseases classified elsewhere: Secondary | ICD-10-CM | POA: Insufficient documentation

## 2019-07-18 DIAGNOSIS — N898 Other specified noninflammatory disorders of vagina: Secondary | ICD-10-CM

## 2019-07-18 DIAGNOSIS — N760 Acute vaginitis: Secondary | ICD-10-CM

## 2019-07-18 LAB — WET PREP FOR TRICH, YEAST, CLUE
Clue Cell Exam: POSITIVE — AB
Trichomonas Exam: NEGATIVE
Yeast Exam: NEGATIVE

## 2019-07-18 MED ORDER — METRONIDAZOLE 500 MG PO TABS: 500.0000 mg | ORAL_TABLET | Freq: Two times a day (BID) | ORAL | 0 refills | Status: AC

## 2019-07-18 NOTE — Patient Instructions (Signed)

## 2019-07-18 NOTE — Progress Notes (Signed)
BP 111/74   Pulse 72   Temp 99.4 F (37.4 C) (Oral)   SpO2 97%    Subjective:    Patient ID: Allison Sanchez, female    DOB: 1995-10-15, 25 y.o.   MRN: [id number]  HPI: Allison Sanchez is a 24 y.o. female  Chief Complaint  Patient presents with  . Vaginal Discharge    pt states she has been having vaginal discharge and slight burning with urination    . This visit was completed via MyChart due to the restrictions of the COVID-19 pandemic. All issues as above were discussed and addressed. Physical exam was done as above through visual confirmation on MyChart. If it was felt that the patient should be evaluated in the office, they were directed there. The patient verbally consented to this visit. . Location of the patient: home . Location of the provider: home . Those involved with this call:  . Provider: Aura Dials, DNP . CMA: Wilhemena Durie, CMA . Front Desk/Registration: Adela Ports  . Time spent on call: 15 minutes with patient face to face via video conference. More than 50% of this time was spent in counseling and coordination of care. 10 minutes total spent in review of patient's record and preparation of their chart.  . I verified patient identity using two factors (patient name and date of birth). Patient consents verbally to being seen via telemedicine visit today.    VAGINAL DISCHARGE Started one week ago with vaginal discharge.  Was treated on 06/14/2019 for UTI with Macrobid.   Duration: days Discharge description: yellow  Pruritus: no Dysuria: yes Malodorous: yes Urinary frequency: no Fevers: no Abdominal pain: no  Sexual activity: monogamous History of sexually transmitted diseases: no Recent antibiotic use: had UTI not long ago and was treated -- Macrobid Context: stable  Treatments attempted: none  Relevant past medical, surgical, family and social history reviewed and updated as indicated. Interim medical history since our last visit  reviewed. Allergies and medications reviewed and updated.  Review of Systems  Constitutional: Negative.   Respiratory: Negative.   Cardiovascular: Negative.   Gastrointestinal: Negative.   Genitourinary: Positive for dysuria and vaginal discharge. Negative for decreased urine volume, flank pain, frequency, hematuria, pelvic pain, urgency and vaginal pain.  Neurological: Negative.   Psychiatric/Behavioral: Negative.     Per HPI unless specifically indicated above     Objective:    BP 111/74   Pulse 72   Temp 99.4 F (37.4 C) (Oral)   SpO2 97%   Wt Readings from Last 3 Encounters:  06/14/19 131 lb (59.4 kg)  04/23/19 133 lb (60.3 kg)  04/22/19 137 lb (62.1 kg)    Physical Exam Vitals and nursing note reviewed.  Constitutional:      General: She is awake. She is not in acute distress.    Appearance: She is well-developed. She is not ill-appearing.  HENT:     Head: Normocephalic.     Right Ear: Hearing normal.     Left Ear: Hearing normal.  Eyes:     General: Lids are normal.        Right eye: No discharge.        Left eye: No discharge.     Conjunctiva/sclera: Conjunctivae normal.  Pulmonary:     Effort: Pulmonary effort is normal. No accessory muscle usage or respiratory distress.  Musculoskeletal:     Cervical back: Normal range of motion.  Neurological:     Mental Status: She is alert and  oriented to person, place, and time.  Psychiatric:        Attention and Perception: Attention normal.        Mood and Affect: Mood normal.        Behavior: Behavior normal. Behavior is cooperative.        Thought Content: Thought content normal.        Judgment: Judgment normal.     Results for orders placed or performed in visit on 06/14/19  Microscopic Examination   URINE  Result Value Ref Range   WBC, UA 11-30 (A) 0 - 5 /hpf   RBC 3-10 (A) 0 - 2 /hpf   Epithelial Cells (non renal) 0-10 0 - 10 /hpf   Bacteria, UA Few (A) None seen/Few  Urine Culture, Reflex   URINE   Result Value Ref Range   Urine Culture, Routine Final report (A)    Organism ID, Bacteria Staphylococcus aureus (A)    Antimicrobial Susceptibility Comment   UA/M w/rflx Culture, Routine   Specimen: Urine   URINE  Result Value Ref Range   Specific Gravity, UA 1.020 1.005 - 1.030   pH, UA 5.0 5.0 - 7.5   Color, UA Yellow Yellow   Appearance Ur Hazy (A) Clear   Leukocytes,UA 3+ (A) Negative   Protein,UA Negative Negative/Trace   Glucose, UA Negative Negative   Ketones, UA Negative Negative   RBC, UA Trace (A) Negative   Bilirubin, UA Negative Negative   Urobilinogen, Ur 0.2 0.2 - 1.0 mg/dL   Nitrite, UA Negative Negative   Microscopic Examination See below:    Urinalysis Reflex Comment       Assessment & Plan:   Problem List Items Addressed This Visit      Genitourinary   Bacterial vaginosis - Primary    Acute with symptoms x 1 week.  UA noting 1+ LEUKS and wet prep + clue cells.  Script sent for Flagyl 500 MG BID x 7 days.  Instructed her to take this with food and avoid alcohol use while taking.  Educated on BV infection and causes.  Return to office for worsening on ongoing symptoms.      Relevant Medications   metroNIDAZOLE (FLAGYL) 500 MG tablet      I discussed the assessment and treatment plan with the patient. The patient was provided an opportunity to ask questions and all were answered. The patient agreed with the plan and demonstrated an understanding of the instructions.   The patient was advised to call back or seek an in-person evaluation if the symptoms worsen or if the condition fails to improve as anticipated.   I provided 15+ minutes of time during this encounter.  Follow up plan: Return if symptoms worsen or fail to improve.

## 2019-07-18 NOTE — Assessment & Plan Note (Signed)
Acute with symptoms x 1 week.  UA noting 1+ LEUKS and wet prep + clue cells.  Script sent for Flagyl 500 MG BID x 7 days.  Instructed her to take this with food and avoid alcohol use while taking.  Educated on BV infection and causes.  Return to office for worsening on ongoing symptoms.

## 2019-07-19 ENCOUNTER — Encounter: Payer: Self-pay | Admitting: Unknown Physician Specialty

## 2019-07-24 ENCOUNTER — Telehealth: Payer: Self-pay | Admitting: Nurse Practitioner

## 2019-07-24 LAB — UA/M W/RFLX CULTURE, ROUTINE
Bilirubin, UA: NEGATIVE
Glucose, UA: NEGATIVE
Ketones, UA: NEGATIVE
Nitrite, UA: NEGATIVE
Protein,UA: NEGATIVE
RBC, UA: NEGATIVE
Specific Gravity, UA: 1.02 (ref 1.005–1.030)
Urobilinogen, Ur: 0.2 mg/dL (ref 0.2–1.0)
pH, UA: 5.5 (ref 5.0–7.5)

## 2019-07-24 LAB — MICROSCOPIC EXAMINATION: RBC, Urine: NONE SEEN /hpf (ref 0–2)

## 2019-07-24 LAB — URINE CULTURE, REFLEX

## 2019-07-24 MED ORDER — AMOXICILLIN-POT CLAVULANATE 500-125 MG PO TABS: 1.0000 | ORAL_TABLET | Freq: Two times a day (BID) | ORAL | 0 refills | Status: AC

## 2019-07-24 NOTE — Telephone Encounter (Signed)
Spoke to patient via telephone and reviewed urine results showing infection.  C&S noting stpah hominis.  Per report is susceptible to oxacillin and therefore may benefit from Augmentin.  Since was just treated in February with Macrobid, will [place] different abx this time.  Script for Augmentin sent.  She stated appreciation for call.

## 2019-08-12 ENCOUNTER — Emergency Department
Admission: EM | Admit: 2019-08-12 | Discharge: 2019-08-12 | Disposition: A | Discharge status: Home / Self Care | Payer: Self-pay | Attending: Emergency Medicine | Admitting: Emergency Medicine

## 2019-08-12 ENCOUNTER — Encounter: Payer: Self-pay | Admitting: Emergency Medicine

## 2019-08-12 ENCOUNTER — Emergency Department: Payer: Self-pay

## 2019-08-12 ENCOUNTER — Other Ambulatory Visit: Payer: Self-pay

## 2019-08-12 DIAGNOSIS — J45909 Unspecified asthma, uncomplicated: Secondary | ICD-10-CM | POA: Insufficient documentation

## 2019-08-12 DIAGNOSIS — J36 Peritonsillar abscess: Secondary | ICD-10-CM | POA: Insufficient documentation

## 2019-08-12 DIAGNOSIS — Z87891 Personal history of nicotine dependence: Secondary | ICD-10-CM | POA: Insufficient documentation

## 2019-08-12 LAB — COMPREHENSIVE METABOLIC PANEL
ALT: 11 U/L (ref 0–44)
AST: 15 U/L (ref 15–41)
Albumin: 4.5 g/dL (ref 3.5–5.0)
Alkaline Phosphatase: 73 U/L (ref 38–126)
Anion gap: 17 — ABNORMAL HIGH (ref 5–15)
BUN: 9 mg/dL (ref 6–20)
CO2: 16 mmol/L — ABNORMAL LOW (ref 22–32)
Calcium: 9.9 mg/dL (ref 8.9–10.3)
Chloride: 104 mmol/L (ref 98–111)
Creatinine, Ser: 0.65 mg/dL (ref 0.44–1.00)
GFR calc Af Amer: >60 mL/min (ref >60)
GFR calc non Af Amer: >60 mL/min (ref >60)
Glucose, Bld: 95 mg/dL (ref 70–99)
Potassium: 4.1 mmol/L (ref 3.5–5.1)
Sodium: 137 mmol/L (ref 135–145)
Total Bilirubin: 1.4 mg/dL — ABNORMAL HIGH (ref 0.3–1.2)
Total Protein: 9.6 g/dL — ABNORMAL HIGH (ref 6.5–8.1)

## 2019-08-12 LAB — CBC WITH DIFFERENTIAL/PLATELET
Abs Immature Granulocytes: 0.09 10*3/uL — ABNORMAL HIGH (ref 0.00–0.07)
Basophils Absolute: 0.1 10*3/uL (ref 0.0–0.1)
Basophils Relative: 0 %
Eosinophils Absolute: 0 10*3/uL (ref 0.0–0.5)
Eosinophils Relative: 0 %
HCT: 41.6 % (ref 36.0–46.0)
Hemoglobin: 14.5 g/dL (ref 12.0–15.0)
Immature Granulocytes: 1 %
Lymphocytes Relative: 6 %
Lymphs Abs: 1.3 10*3/uL (ref 0.7–4.0)
MCH: 30.9 pg (ref 26.0–34.0)
MCHC: 34.9 g/dL (ref 30.0–36.0)
MCV: 88.7 fL (ref 80.0–100.0)
Monocytes Absolute: 1.7 10*3/uL — ABNORMAL HIGH (ref 0.1–1.0)
Monocytes Relative: 9 %
Neutro Abs: 16.6 10*3/uL — ABNORMAL HIGH (ref 1.7–7.7)
Neutrophils Relative %: 84 %
Platelets: 233 10*3/uL (ref 150–400)
RBC: 4.69 MIL/uL (ref 3.87–5.11)
RDW: 11.4 % — ABNORMAL LOW (ref 11.5–15.5)
WBC: 19.7 10*3/uL — ABNORMAL HIGH (ref 4.0–10.5)
nRBC: 0 % (ref 0.0–0.2)

## 2019-08-12 LAB — MONONUCLEOSIS SCREEN: Mono Screen: NEGATIVE

## 2019-08-12 LAB — LACTIC ACID, PLASMA: Lactic Acid, Venous: 0.7 mmol/L (ref 0.5–1.9)

## 2019-08-12 MED ORDER — BENZOCAINE 20 % MT SOLN: Freq: Once | OROMUCOSAL | Status: AC

## 2019-08-12 MED ORDER — LIDOCAINE VISCOUS HCL 2 % MT SOLN: 15.0000 mL | OROMUCOSAL | 1 refills | Status: DC | PRN

## 2019-08-12 MED ORDER — KETOROLAC TROMETHAMINE 30 MG/ML IJ SOLN
30.0000 mg | Freq: Once | INTRAMUSCULAR | Status: AC
  Administered 2019-08-12: 30 mg via INTRAVENOUS
  Filled 2019-08-12: qty 1

## 2019-08-12 MED ORDER — SODIUM CHLORIDE 0.9 % IV SOLN
1.0000 g | Freq: Once | INTRAVENOUS | Status: AC
  Administered 2019-08-12: 1 g via INTRAVENOUS
  Filled 2019-08-12: qty 10

## 2019-08-12 MED ORDER — LIDOCAINE HCL (PF) 1 % IJ SOLN
5.0000 mL | Freq: Once | INTRAMUSCULAR | Status: AC
  Administered 2019-08-12: 5 mL
  Filled 2019-08-12: qty 5

## 2019-08-12 MED ORDER — DEXAMETHASONE SODIUM PHOSPHATE 10 MG/ML IJ SOLN
10.0000 mg | Freq: Once | INTRAMUSCULAR | Status: AC
  Administered 2019-08-12: 10 mg via INTRAVENOUS
  Filled 2019-08-12: qty 1

## 2019-08-12 MED ORDER — IOHEXOL 300 MG/ML  SOLN
75.0000 mL | Freq: Once | INTRAMUSCULAR | Status: AC | PRN
  Administered 2019-08-12: 75 mL via INTRAVENOUS

## 2019-08-12 MED ORDER — SODIUM CHLORIDE 0.9 % IV BOLUS
1000.0000 mL | Freq: Once | INTRAVENOUS | Status: AC
  Administered 2019-08-12: 1000 mL via INTRAVENOUS

## 2019-08-12 MED ORDER — PREDNISONE 10 MG (21) PO TBPK: ORAL_TABLET | ORAL | 0 refills | Status: DC

## 2019-08-12 MED ORDER — MORPHINE SULFATE (PF) 4 MG/ML IV SOLN
4.0000 mg | Freq: Once | INTRAVENOUS | Status: DC
  Filled 2019-08-12: qty 1

## 2019-08-12 NOTE — ED Triage Notes (Signed)
Patient reports she tested positive for strep on Friday. Has been taking antibiotics but not having any relief. Reports continued sore throat and pain in left ear.

## 2019-08-12 NOTE — ED Notes (Signed)
Report received from [place], [place]. Pt is sitting comfortably in hospital bed. Explained to pt plan of care at this time. Offered pt pain medicine but refused at this time. Rated pain 2/10.

## 2019-08-12 NOTE — ED Notes (Signed)
Off unit to ct. ivf infusing meds given as ordered.

## 2019-08-12 NOTE — ED Provider Notes (Signed)
[facility] Emergency Department Provider Note       Time seen: ----------------------------------------- 9:21 AM on 08/12/2019 -----------------------------------------   I have reviewed the triage vital signs and the nursing notes.  HISTORY   Chief Complaint Sore Throat   HPI Allison Sanchez is a 24 y.o. female with a history of asthma, bacterial vaginosis, UTI who presents to the ED for sore throat for about a week.  Patient reports being tested positive for strep on Friday, she is taking antibiotics without any relief.  She continues to have sore throat and pain in the left ear.  Discomfort is 10 out of 10.  She states she cannot eat or drink much of anything  Past Medical History:  Diagnosis Date  . Asthma   . BV (bacterial vaginosis)   . History of twin pregnancy in prior pregnancy   . Premature delivery   . UTI (urinary tract infection)     Patient Active Problem List   Diagnosis Date Noted  . Bacterial vaginosis 07/18/2019  . Left-sided chest wall pain 05/08/2017  . Gastroesophageal reflux disease with esophagitis 02/26/2014    Past Surgical History:  Procedure Laterality Date  . CESAREAN SECTION N/A 01/10/2016   Procedure: CESAREAN SECTION;  Surgeon: Mora Bellman, MD;  Location: [facility];  Service: Obstetrics;  Laterality: N/A;    Allergies Shellfish allergy  Social History Social History   Tobacco Use  . Smoking status: Former Smoker    Types: Cigarettes  . Smokeless tobacco: Never Used  Substance Use Topics  . Alcohol use: No  . Drug use: No    Review of Systems Constitutional: Positive for fever HEENT: Positive for sore throat, positive for difficulty swallowing, left ear pain Cardiovascular: Negative for chest pain. Respiratory: Negative for shortness of breath. Gastrointestinal: Negative for abdominal pain, vomiting and diarrhea. Musculoskeletal: Negative for back pain. Skin: Negative for  rash. Neurological: Negative for headaches, focal weakness or numbness.  All systems negative/normal/unremarkable except as stated in the HPI  ____________________________________________   PHYSICAL EXAM:  VITAL SIGNS: ED Triage Vitals  Enc Vitals Group     BP 08/12/19 0704 118/87     Pulse Rate 08/12/19 0704 (!) 140     Resp 08/12/19 0704 (!) 22     Temp 08/12/19 0704 100.3 F (37.9 C)     Temp Source 08/12/19 0704 Oral     SpO2 08/12/19 0704 95 %     Weight 08/12/19 0704 123 lb (55.8 kg)     Height 08/12/19 0713 5\' 1"  (1.549 m)     Head Circumference --      Peak Flow --      Pain Score 08/12/19 0713 10     Pain Loc --      Pain Edu? --      Excl. in [place]? --     Constitutional: Alert and oriented. Well appearing and in no distress. Eyes: Conjunctivae are normal. Normal extraocular movements. ENT      Head: Normocephalic and atraumatic.      Nose: No congestion/rhinnorhea.      Mouth/Throat: Mucous membranes are moist, mild erythema, left tonsil is enlarged compared to right, there is some uvular edema      Neck: No stridor. Cardiovascular: Normal rate, regular rhythm. No murmurs, rubs, or gallops. Respiratory: Normal respiratory effort without tachypnea nor retractions. Breath sounds are clear and equal bilaterally. No wheezes/rales/rhonchi. Gastrointestinal: Soft and nontender. Normal bowel sounds Musculoskeletal: Nontender with normal range of motion  in extremities. No lower extremity tenderness nor edema. Neurologic:  Normal speech and language. No gross focal neurologic deficits are appreciated.  Skin:  Skin is warm, dry and intact. No rash noted. Psychiatric: Mood and affect are normal. Speech and behavior are normal.  ____________________________________________  ED COURSE:  As part of my medical decision making, I reviewed the following data within the electronic MEDICAL RECORD NUMBER History obtained from family if available, nursing notes, old chart and ekg, as  well as notes from prior ED visits. Patient presented for sore throat, we will assess with labs and imaging as indicated at this time.   Marland Kitchen.Incision and Drainage  Date/Time: 08/12/2019 12:58 PM Performed by: Emily Filbert, MD Authorized by: Emily Filbert, MD   Consent:    Consent obtained:  Verbal   Consent given by:  Patient Location:    Type:  Abscess   Location:  Mouth   Mouth location:  Peritonsillar Anesthesia (see MAR for exact dosages):    Anesthesia method:  Local infiltration   Local anesthetic:  Lidocaine 1% w/o epi Procedure type:    Complexity:  Simple Procedure details:    Needle aspiration: yes     Needle size:  18 G   Drainage:  Bloody and purulent   Drainage amount:  Scant   Packing materials:  None Post-procedure details:    Patient tolerance of procedure:  Tolerated well, no immediate complications    Allison Sanchez was evaluated in Emergency Department on 08/12/2019 for the symptoms described in the history of present illness. She was evaluated in the context of the global COVID-19 pandemic, which necessitated consideration that the patient might be at risk for infection with the SARS-CoV-2 virus that causes COVID-19. Institutional protocols and algorithms that pertain to the evaluation of patients at risk for COVID-19 are in a state of rapid change based on information released by regulatory bodies including the CDC and federal and state organizations. These policies and algorithms were followed during the patient's care in the ED.  ____________________________________________   LABS (pertinent positives/negatives)  Labs Reviewed  COMPREHENSIVE METABOLIC PANEL - Abnormal; Notable for the following components:      Result Value   CO2 16 (*)    Total Protein 9.6 (*)    Total Bilirubin 1.4 (*)    Anion gap 17 (*)    All other components within normal limits  CBC WITH DIFFERENTIAL/PLATELET - Abnormal; Notable for the following components:    WBC 19.7 (*)    RDW 11.4 (*)    Neutro Abs 16.6 (*)    Monocytes Absolute 1.7 (*)    Abs Immature Granulocytes 0.09 (*)    All other components within normal limits  LACTIC ACID, PLASMA  MONONUCLEOSIS SCREEN  LACTIC ACID, PLASMA  URINALYSIS, COMPLETE (UACMP) WITH MICROSCOPIC  POC URINE PREG, ED    RADIOLOGY Images were viewed by me  CT neck with contrast IMPRESSION:  Prominence of the palatine tonsils consistent with tonsillitis.  There is a 3.1 x 2.4 x 3.6 cm left peritonsillar abscess. The  oropharyngeal airway is patent. Associated small retropharyngeal  effusion at the C2-C6 levels.   Bilateral cervical lymphadenopathy, most notably at the level 2  stations, likely reactive.  ____________________________________________   DIFFERENTIAL DIAGNOSIS   Strep pharyngitis, peritonsillar abscess, tonsillitis, dehydration  FINAL ASSESSMENT AND PLAN  Peritonsillar abscess   Plan: The patient had presented for persistent sore throat. Patient's labs did reveal significant leukocytosis. Patient's imaging was concerning for 3 x  2 x 3 cm left peritonsillar abscess.  I attempted to drain via aspiration with some success.  I discussed with ENT who will see her this afternoon and follow-up in reaspirate if needed.  She has received Decadron and IV antibiotics here.  She can swallow well here, no current bleeding.   Ulice Dash, MD    Note: This note was generated in part or whole with voice recognition software. Voice recognition is usually quite accurate but there are transcription errors that can and very often do occur. I apologize for any typographical errors that were not detected and corrected.     Emily Filbert, MD 08/12/19 1259

## 2019-08-27 IMAGING — CR DG RIBS 2V*L*
1 series · 2 of 2 positions shown · non-contrast
Comparison: 02/16/2014

CLINICAL DATA: Left-sided chest pain, no known injury, initial
encounter

EXAM:
LEFT RIBS - 2 VIEW

[Series 1: dg ribs unilateral left · 0.14mm/px · 2 of 2 slices shown]
[im 1/2]
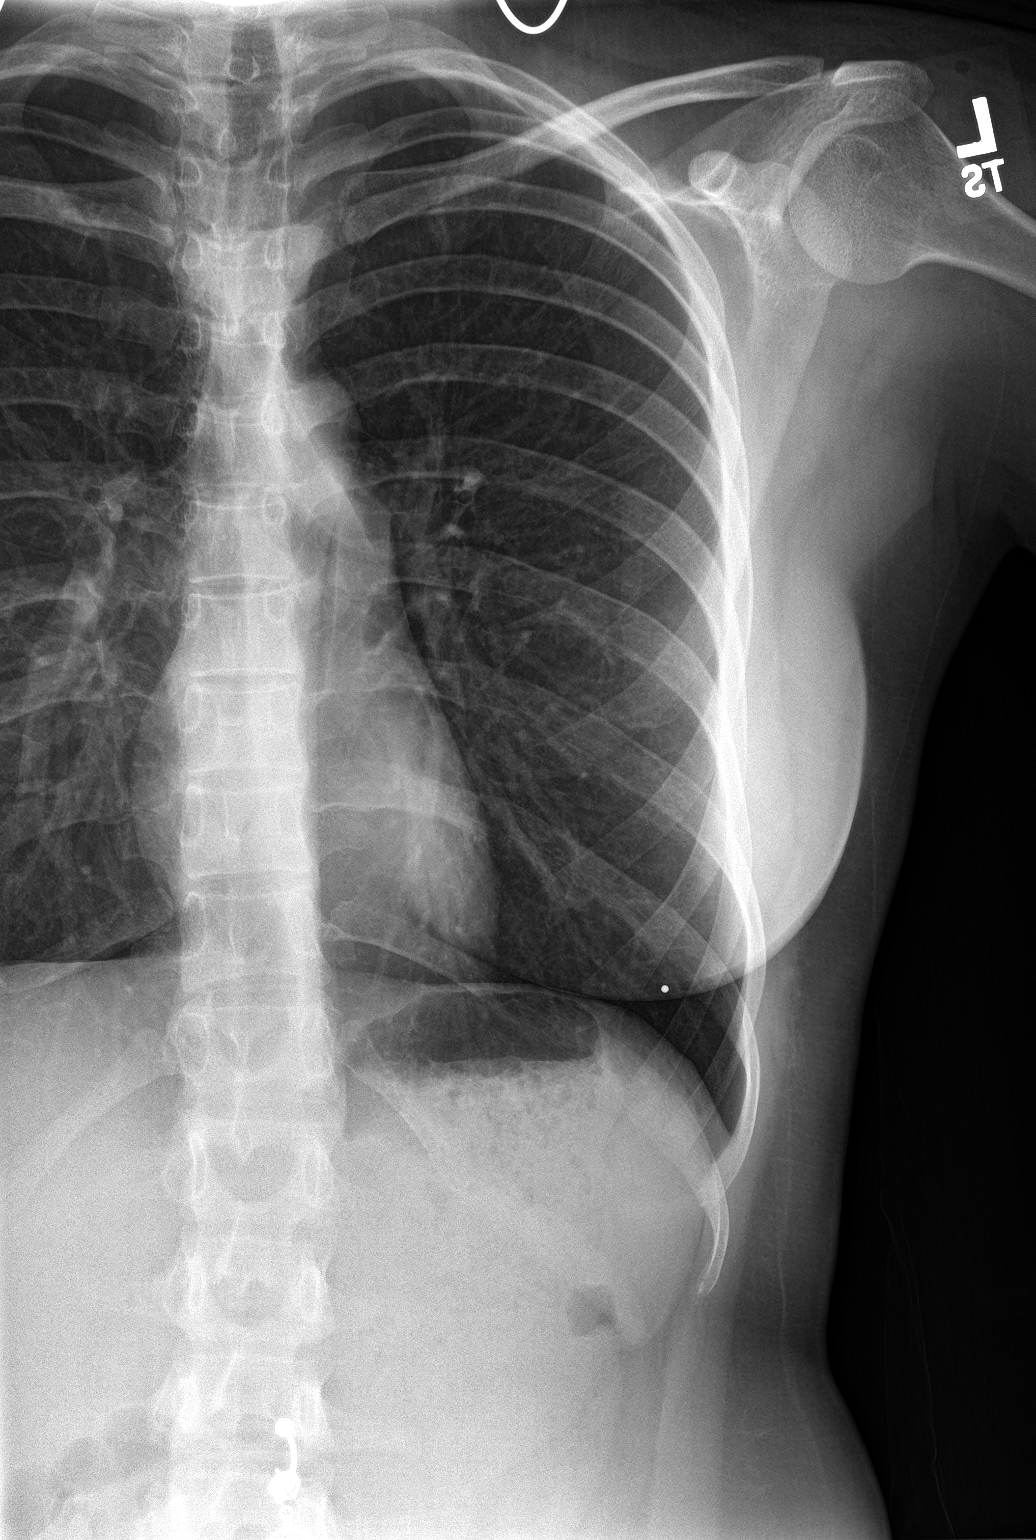
[im 2/2]
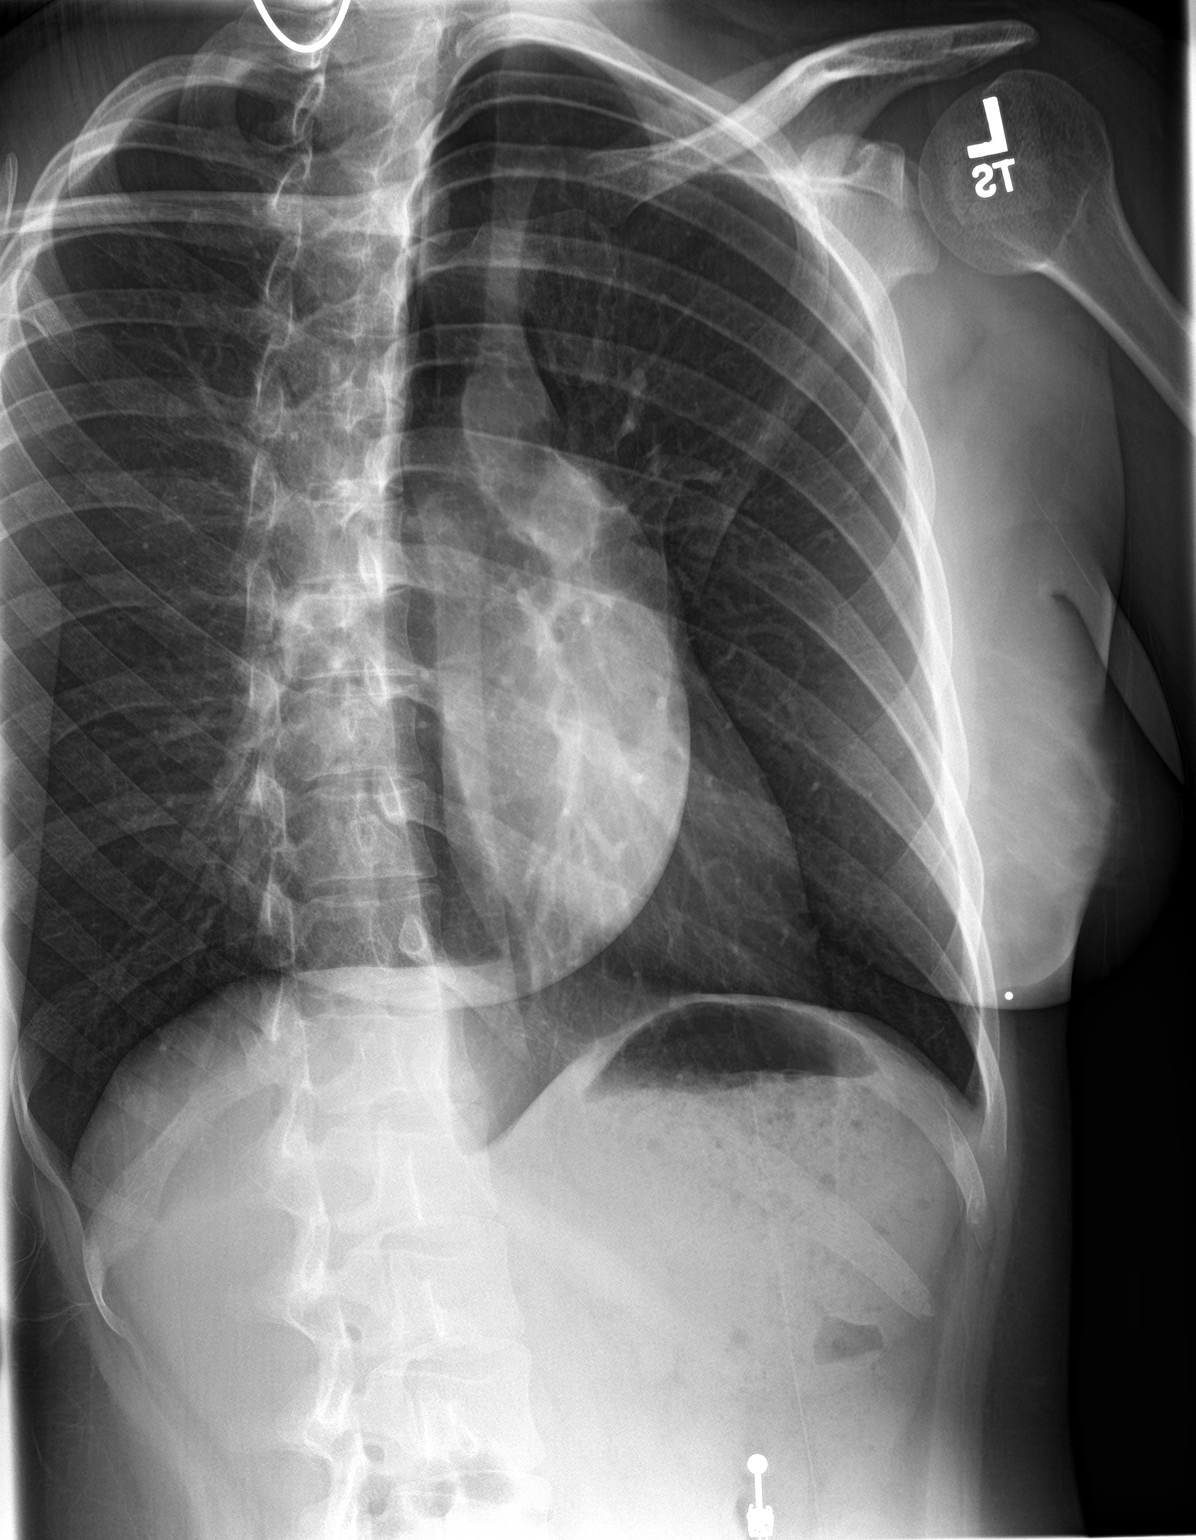

[2 of 2 positions shown; findings below may reference images not displayed]

FINDINGS: Cardiac shadow is within normal limits. Left lung is well aerated.
No focal infiltrate, effusion or pneumothorax is seen. No acute rib
fracture is noted.
IMPRESSION: No rib fractures identified.

## 2019-08-30 ENCOUNTER — Other Ambulatory Visit: Payer: Self-pay

## 2019-08-30 ENCOUNTER — Ambulatory Visit (INDEPENDENT_AMBULATORY_CARE_PROVIDER_SITE_OTHER): Payer: Self-pay | Admitting: Unknown Physician Specialty

## 2019-08-30 ENCOUNTER — Encounter: Payer: Self-pay | Admitting: Unknown Physician Specialty

## 2019-08-30 VITALS — BP 103/62 | HR 75 | Temp 98.3°F | Ht 61.0 in | Wt 124.0 lb

## 2019-08-30 DIAGNOSIS — G44229 Chronic tension-type headache, not intractable: Secondary | ICD-10-CM

## 2019-08-30 DIAGNOSIS — N390 Urinary tract infection, site not specified: Secondary | ICD-10-CM

## 2019-08-30 DIAGNOSIS — R399 Unspecified symptoms and signs involving the genitourinary system: Secondary | ICD-10-CM

## 2019-08-30 NOTE — Progress Notes (Signed)
BP 103/62 (BP Location: Right Arm, Patient Position: Sitting, Cuff Size: Normal)   Pulse 75   Temp 98.3 F (36.8 C) (Oral)   Ht 5\' 1"  (1.549 m)   Wt 124 lb (56.2 kg)   BMI 23.43 kg/m    Subjective:    Patient ID: , female    DOB: 02/14/1996, 24 y.o.   MRN: [id number]  HPI: Allison Sanchez is a 24 y.o. female  Chief Complaint  Patient presents with  . Dysuria    Patient's still having buring with urination since being diagnosed with a UTI in February.    Pt with significant recent history of UTI since February.  Given Macrobid and took for 1 week without any relief.  Later started Augmentin after no improvement.  Then given PCN for tonsilitis and Augmentin again.    This problem has been ongoing for years and has been to Urology in the past without any real help.    Pt states she is having burning on urination without unusual discharge.  No bleeding noted.    LMP was 4/26-4/30 and on Sprintec.    Tension hedaches: States these have been ongoing.  Resolved with Naprosyn if she is proactive.     Relevant past medical, surgical, family and social history reviewed and updated as indicated. Interim medical history since our last visit reviewed. Allergies and medications reviewed and updated.  Review of Systems  Per HPI unless specifically indicated above     Objective:    BP 103/62 (BP Location: Right Arm, Patient Position: Sitting, Cuff Size: Normal)   Pulse 75   Temp 98.3 F (36.8 C) (Oral)   Ht 5\' 1"  (1.549 m)   Wt 124 lb (56.2 kg)   BMI 23.43 kg/m   Wt Readings from Last 3 Encounters:  08/30/19 124 lb (56.2 kg)  08/12/19 123 lb (55.8 kg)  06/14/19 131 lb (59.4 kg)    Physical Exam Constitutional:      General: She is not in acute distress.    Appearance: Normal appearance. She is well-developed.  HENT:     Head: Normocephalic and atraumatic.  Eyes:     General: Lids are normal. No scleral icterus.       Right eye: No discharge.      Left eye: No discharge.     Conjunctiva/sclera: Conjunctivae normal.  Neck:     Vascular: No carotid bruit or JVD.  Cardiovascular:     Rate and Rhythm: Normal rate and regular rhythm.     Heart sounds: Normal heart sounds.  Pulmonary:     Effort: Pulmonary effort is normal.     Breath sounds: Normal breath sounds.  Abdominal:     General: Abdomen is flat. There is no distension.     Palpations: There is no hepatomegaly or splenomegaly.     Tenderness: There is no right CVA tenderness, left CVA tenderness, guarding or rebound.  Musculoskeletal:        General: Normal range of motion.     Cervical back: Normal range of motion and neck supple.  Skin:    General: Skin is warm and dry.     Coloration: Skin is not pale.     Findings: No rash.  Neurological:     Mental Status: She is alert and oriented to person, place, and time.  Psychiatric:        Behavior: Behavior normal.        Thought Content: Thought content normal.  Judgment: Judgment normal.     Urine >30 WBCs and positive leukocytes.    C&S last time showed sensitivity to a number of antibiotics  Results for orders placed or performed during the hospital encounter of 08/12/19  Lactic acid, plasma  Result Value Ref Range   Lactic Acid, Venous 0.7 0.5 - 1.9 mmol/L  Comprehensive metabolic panel  Result Value Ref Range   Sodium 137 135 - 145 mmol/L   Potassium 4.1 3.5 - 5.1 mmol/L   Chloride 104 98 - 111 mmol/L   CO2 16 (L) 22 - 32 mmol/L   Glucose, Bld 95 70 - 99 mg/dL   BUN 9 6 - 20 mg/dL   Creatinine, Ser 0.65 0.44 - 1.00 mg/dL   Calcium 9.9 8.9 - 10.3 mg/dL   Total Protein 9.6 (H) 6.5 - 8.1 g/dL   Albumin 4.5 3.5 - 5.0 g/dL   AST 15 15 - 41 U/L   ALT 11 0 - 44 U/L   Alkaline Phosphatase 73 38 - 126 U/L   Total Bilirubin 1.4 (H) 0.3 - 1.2 mg/dL   GFR calc non Af Amer >60 >60 mL/min   GFR calc Af Amer >60 >60 mL/min   Anion gap 17 (H) 5 - 15  CBC with Differential  Result Value Ref Range   WBC 19.7  (H) 4.0 - 10.5 K/uL   RBC 4.69 3.87 - 5.11 MIL/uL   Hemoglobin 14.5 12.0 - 15.0 g/dL   HCT 41.6 36.0 - 46.0 %   MCV 88.7 80.0 - 100.0 fL   MCH 30.9 26.0 - 34.0 pg   MCHC 34.9 30.0 - 36.0 g/dL   RDW 11.4 (L) 11.5 - 15.5 %   Platelets 233 150 - 400 K/uL   nRBC 0.0 0.0 - 0.2 %   Neutrophils Relative % 84 %   Neutro Abs 16.6 (H) 1.7 - 7.7 K/uL   Lymphocytes Relative 6 %   Lymphs Abs 1.3 0.7 - 4.0 K/uL   Monocytes Relative 9 %   Monocytes Absolute 1.7 (H) 0.1 - 1.0 K/uL   Eosinophils Relative 0 %   Eosinophils Absolute 0.0 0.0 - 0.5 K/uL   Basophils Relative 0 %   Basophils Absolute 0.1 0.0 - 0.1 K/uL   Immature Granulocytes 1 %   Abs Immature Granulocytes 0.09 (H) 0.00 - 0.07 K/uL  Mononucleosis screen  Result Value Ref Range   Mono Screen NEGATIVE NEGATIVE      Assessment & Plan:   Problem List Items Addressed This Visit    None    Visit Diagnoses    UTI symptoms    -  Primary   Urine is positive for WBCs and leuk.  Will await a C&S for further treatment.  ? interstitial cystitis.    Relevant Orders   UA/M w/rflx Culture, Routine   Ambulatory referral to Urology   Chronic UTI       Will refer back to Urology.  Await culture results for further treatment as been on a number of antibiotics   Relevant Orders   Ambulatory referral to Urology   Chronic tension-type headache, not intractable       Discussed prn NSAIDS.  Discussed yoga exercises.         Follow up plan:  F/u urine culture.  Refer to Urology.

## 2019-09-01 LAB — UA/M W/RFLX CULTURE, ROUTINE
Bilirubin, UA: NEGATIVE
Glucose, UA: NEGATIVE
Nitrite, UA: NEGATIVE
Protein,UA: NEGATIVE
Specific Gravity, UA: 1.025 (ref 1.005–1.030)
Urobilinogen, Ur: 0.2 mg/dL (ref 0.2–1.0)
pH, UA: 5 (ref 5.0–7.5)

## 2019-09-01 LAB — MICROSCOPIC EXAMINATION: WBC, UA: >30 /hpf — AB (ref 0–5)

## 2019-09-01 LAB — URINE CULTURE, REFLEX: Organism ID, Bacteria: NO GROWTH

## 2019-09-03 ENCOUNTER — Other Ambulatory Visit: Payer: Self-pay

## 2019-09-03 ENCOUNTER — Ambulatory Visit: Payer: Self-pay | Admitting: Nurse Practitioner

## 2019-09-03 ENCOUNTER — Encounter: Payer: Self-pay | Admitting: Urology

## 2019-09-03 ENCOUNTER — Ambulatory Visit (INDEPENDENT_AMBULATORY_CARE_PROVIDER_SITE_OTHER): Payer: Self-pay | Admitting: Urology

## 2019-09-03 VITALS — BP 109/72 | HR 72 | Ht 61.0 in | Wt 124.0 lb

## 2019-09-03 DIAGNOSIS — N390 Urinary tract infection, site not specified: Secondary | ICD-10-CM

## 2019-09-03 DIAGNOSIS — R3129 Other microscopic hematuria: Secondary | ICD-10-CM

## 2019-09-03 DIAGNOSIS — B379 Candidiasis, unspecified: Secondary | ICD-10-CM

## 2019-09-03 MED ORDER — FLUCONAZOLE 150 MG PO TABS: 150.0000 mg | ORAL_TABLET | Freq: Once | ORAL | 0 refills | Status: AC

## 2019-09-03 NOTE — Progress Notes (Signed)
09/03/2019 3:01 PM   Allison Sanchez 29-Dec-1995 [id number]  Referring provider: Kathrine Haddock, NP [address],  [place] [address]  Chief Complaint  Patient presents with  . Hematuria    HPI: Allison Sanchez is a 24 year old female with chronic UTI's who is referred by Kathrine Haddock, NP for hematuria.  Reviewing her records: + Staphylococcus hominis resistant to PCN on 07/18/2019 + Staphylococcus aureus resistant to PCN on 06/14/2019 + E. Coli on 02/27/2019  UA on 08/30/2019 3-10 RBC's.  Urine culture was negative.    Her symptoms with a urinary tract infection consist of urgency, dysuria and post void urgency.  She states the symptoms do clear after 2 days of antibiotic.  She finds that colored sodas worsen the symptoms.    She denies gross hematuria, suprapubic pain, back pain, abdominal pain or flank pain associated with UTI's.    She has not had any recent fevers, chills, nausea or vomiting associated with UTI's.   She is asymptomatic today.  UA is benign.   She does not have a history of nephrolithiasis, GU surgery or GU trauma.  She is sexually active, but she has curtailed her sexual activity due to infections.  She has not noted a correlation with her urinary tract infections and sexual intercourse with these recent infections.    She denies constipation and/or diarrhea.   She does engage in good perineal hygiene. She does not take tub baths.   She is drinking a lot of water and Ocean Spr[employer] juices, but she does not enjoy cranberry juices.    I had seen her in 2019 for similar complaints.  RUS 10/2017 was negative.  She was asked to return, but she was lost to follow up.   PMH: Past Medical History:  Diagnosis Date  . Asthma   . BV (bacterial vaginosis)   . History of twin pregnancy in prior pregnancy   . Premature delivery   . UTI (urinary tract infection)     Surgical History: Past Surgical History:  Procedure Laterality Date  . CESAREAN SECTION N/A  01/10/2016   Procedure: CESAREAN SECTION;  Surgeon: Mora Bellman, MD;  Location: [facility];  Service: Obstetrics;  Laterality: N/A;    Home Medications:  Allergies as of 09/03/2019      Reactions   Shellfish Allergy Swelling      Medication List       Accurate as of Sep 03, 2019  3:01 PM. If you have any questions, ask your nurse or doctor.        albuterol 108 (90 Base) MCG/ACT inhaler Commonly known as: VENTOLIN HFA Inhale 2 puffs into the lungs every 6 (six) hours as needed.   EPINEPHrine 0.3 mg/0.3 mL Soaj injection Commonly known as: EPI-PEN Inject 0.3 mg into the muscle as needed for anaphylaxis.   naproxen 500 MG tablet Commonly known as: NAPROSYN Take 1 tablet (500 mg total) by mouth 2 (two) times daily with a meal.   norgestimate-ethinyl estradiol 0.25-35 MG-MCG tablet Commonly known as: Sprintec 28 Take 1 tablet by mouth daily.   SUMAtriptan 50 MG tablet Commonly known as: IMITREX TAKE 1 TAB AT ONSET OF MIGRAINE. MAY REPEAT IN 2 HOURS IF HEADACHE PERSISTS OR RECURS. MAX OF 2 TABS IN A DAY       Allergies:  Allergies  Allergen Reactions  . Shellfish Allergy Swelling    Family History: Family History  Problem Relation Age of Onset  . Hypertension Mother   .  Sickle cell trait Mother   . Hypertension Daughter        pulmonary  . Cancer Paternal Uncle   . Gout Maternal Grandfather   . Cancer Paternal Uncle   . Cancer Paternal Uncle   . Bladder Cancer Neg Hx   . Kidney cancer Neg Hx     Social History:  reports that she has quit smoking. Her smoking use included cigarettes. She has never used smokeless tobacco. She reports that she does not drink alcohol or use drugs.  ROS: Pertinent ROS in HPI  Physical Exam: BP 109/72   Pulse 72   Ht '5\' 1"'  (1.549 m)   Wt 124 lb (56.2 kg)   BMI 23.43 kg/m   Constitutional:  Well nourished. Alert and oriented, No acute distress. HEENT: [place] AT, mask in place  Trachea midline Cardiovascular: No  clubbing, cyanosis, or edema. Respiratory: Normal respiratory effort, no increased work of breathing. GI: Abdomen is soft, non tender, non distended, no abdominal masses.  GU: No CVA tenderness.  No bladder fullness or masses.  Normal external genitalia, normal pubic hair distribution, no lesions.  Normal urethral meatus, no lesions, no prolapse, no discharge.   No urethral masses, tenderness and/or tenderness. No bladder fullness, tenderness or masses. Pink vagina mucosa, good estrogen effect, discharge consistent with candidiasis present,  no lesions, good pelvic support, no cystocele and norectocele noted.  No cervical motion tenderness.  Uterus is freely mobile and non-fixed.  No adnexal/parametria masses or tenderness noted.  Anus and perineum are without rashes or lesions.    Skin: No rashes, bruises or suspicious lesions. Lymph: No inguinal adenopathy. Neurologic: Grossly intact, no focal deficits, moving all 4 extremities. Psychiatric: Normal mood and affect.   Laboratory Data: Lab Results  Component Value Date   WBC 19.7 (H) 08/12/2019   HGB 14.5 08/12/2019   HCT 41.6 08/12/2019   MCV 88.7 08/12/2019   PLT 233 08/12/2019    Lab Results  Component Value Date   CREATININE 0.65 08/12/2019    Lab Results  Component Value Date   AST 15 08/12/2019   Lab Results  Component Value Date   ALT 11 08/12/2019    Urinalysis Component     Latest Ref Rng & Units 09/03/2019  Specific Gravity, UA     1.005 - 1.030 1.025  pH, UA     5.0 - 7.5 5.0  Color, UA     Yellow Yellow  Appearance Ur     Clear Cloudy (A)  Leukocytes,UA     Negative Trace (A)  Protein,UA     Negative/Trace Negative  Glucose, UA     Negative Negative  Ketones, UA     Negative Negative  RBC, UA     Negative Negative  Bilirubin, UA     Negative Negative  Urobilinogen, Ur     0.2 - 1.0 mg/dL 0.2  Nitrite, UA     Negative Negative  Microscopic Examination      See below:   Component     Latest Ref  Rng & Units 09/03/2019  WBC, UA     0 - 5 /hpf 0-5  RBC     0 - 2 /hpf 0-2  Epithelial Cells (non renal)     0 - 10 /hpf 0-10  Bacteria, UA     None seen/Few Moderate (A)    I have reviewed the labs.   Pertinent Imaging: No recent imaging.    Assessment & Plan:    1.  Microscopic hematuria - Seen on 08/30/2019 UA with Kathrine Haddock, NP with negative culture  - Urinalysis, Complete - CULTURE, URINE COMPREHENSIVE - Will schedule RUS and cystoscopy for further evaluation  2. rUTI's - Criteria for recurrent UTI has been met with 2 or more infections in 6 months or 3 or greater infections in one year  - Continue good fluid intake - Advised patient that she may take cranberry tablets instead of drinking the juice  3. Vaginal yeast infection - prescription given for Diflucan 150 mg - take one tablet                                         Return for RUS report and cystoscopy .  These notes generated with voice recognition software. I apologize for typographical errors.  Zara Council, PA-C  [facility] [address]  [place], [place] [address] [phone]

## 2019-09-04 ENCOUNTER — Encounter: Payer: Self-pay | Admitting: Nurse Practitioner

## 2019-09-04 ENCOUNTER — Other Ambulatory Visit (HOSPITAL_COMMUNITY)
Admission: RE | Admit: 2019-09-04 | Discharge: 2019-09-04 | Disposition: A | Discharge status: Home / Self Care | Payer: Self-pay | Source: Ambulatory Visit | Attending: Nurse Practitioner | Admitting: Nurse Practitioner

## 2019-09-04 ENCOUNTER — Ambulatory Visit (INDEPENDENT_AMBULATORY_CARE_PROVIDER_SITE_OTHER): Payer: Self-pay | Admitting: Nurse Practitioner

## 2019-09-04 VITALS — BP 98/62 | HR 66 | Temp 98.1°F | Ht 60.2 in | Wt 126.0 lb

## 2019-09-04 DIAGNOSIS — Z118 Encounter for screening for other infectious and parasitic diseases: Secondary | ICD-10-CM

## 2019-09-04 DIAGNOSIS — G43109 Migraine with aura, not intractable, without status migrainosus: Secondary | ICD-10-CM

## 2019-09-04 DIAGNOSIS — G43909 Migraine, unspecified, not intractable, without status migrainosus: Secondary | ICD-10-CM | POA: Insufficient documentation

## 2019-09-04 DIAGNOSIS — Z124 Encounter for screening for malignant neoplasm of cervix: Secondary | ICD-10-CM | POA: Insufficient documentation

## 2019-09-04 DIAGNOSIS — Z23 Encounter for immunization: Secondary | ICD-10-CM

## 2019-09-04 DIAGNOSIS — Z91013 Allergy to seafood: Secondary | ICD-10-CM

## 2019-09-04 DIAGNOSIS — Z0000 Encounter for general adult medical examination without abnormal findings: Secondary | ICD-10-CM

## 2019-09-04 LAB — URINALYSIS, COMPLETE
Bilirubin, UA: NEGATIVE
Glucose, UA: NEGATIVE
Ketones, UA: NEGATIVE
Nitrite, UA: NEGATIVE
Protein,UA: NEGATIVE
RBC, UA: NEGATIVE
Specific Gravity, UA: 1.025 (ref 1.005–1.030)
Urobilinogen, Ur: 0.2 mg/dL (ref 0.2–1.0)
pH, UA: 5 (ref 5.0–7.5)

## 2019-09-04 LAB — MICROSCOPIC EXAMINATION

## 2019-09-04 NOTE — Assessment & Plan Note (Signed)
Repeat shellfish allergy screening.

## 2019-09-04 NOTE — Patient Instructions (Signed)

## 2019-09-04 NOTE — Assessment & Plan Note (Signed)
Chronic, ongoing.  Stable with use of Naproxen and Imitrex.  Continue this regimen and adjust as needed. Plan to see back in 6 months, sooner if worsening symptoms present.

## 2019-09-04 NOTE — Progress Notes (Signed)
BP 98/62   Pulse 66   Temp 98.1 F (36.7 C) (Oral)   Ht 5' 0.2" (1.529 m)   Wt 126 lb (57.2 kg)   LMP 08/19/2019 (Approximate)   BMI 24.44 kg/m    Subjective:    Patient ID: Allison Sanchez, female    DOB: [birth year], 24 y.o.   MRN: [id number]  HPI: Allison Sanchez is a 24 y.o. female presenting on 09/04/2019 for comprehensive medical examination. Current medical complaints include:none  She currently lives with: spouse and children Menopausal Symptoms: no   She would like testing redone for shellfish allergy, was highly allergic in past and would like to see if still present.  Takes Naproxen and Imitrex for migraines + is on BCP, this controls headaches.  Has had migraines present for 6 months, since November.  Has not had migraine in 2 weeks, only takes Naproxen and has not taken Imitrex.    Depression Screen done today and results listed below:  Depression screen [facility] 2/9 09/04/2019 04/23/2019 05/04/2017  Decreased Interest 0 1 0  Down, Depressed, Hopeless 0 1 0  PHQ - 2 Score 0 2 0  Altered sleeping - 0 -  Tired, decreased energy - 1 -  Change in appetite - 0 -  Feeling bad or failure about yourself  - 1 -  Trouble concentrating - 1 -  Moving slowly or fidgety/restless - 0 -  Suicidal thoughts - 0 -  PHQ-9 Score - 5 -    The patient does not have a history of falls. I did not complete a risk assessment for falls. A plan of care for falls was not documented.   Past Medical History:  Past Medical History:  Diagnosis Date  . Asthma   . BV (bacterial vaginosis)   . History of twin pregnancy in prior pregnancy   . Premature delivery   . UTI (urinary tract infection)     Surgical History:  Past Surgical History:  Procedure Laterality Date  . CESAREAN SECTION N/A 01/10/2016   Procedure: CESAREAN SECTION;  Surgeon: Mora Bellman, MD;  Location: [facility];  Service: Obstetrics;  Laterality: N/A;    Medications:  Current Outpatient Medications on File Prior  to Visit  Medication Sig  . albuterol (PROVENTIL HFA;VENTOLIN HFA) 108 (90 Base) MCG/ACT inhaler Inhale 2 puffs into the lungs every 6 (six) hours as needed.  Marland Kitchen EPINEPHrine 0.3 mg/0.3 mL IJ SOAJ injection Inject 0.3 mg into the muscle as needed for anaphylaxis.   . naproxen (NAPROSYN) 500 MG tablet Take 1 tablet (500 mg total) by mouth 2 (two) times daily with a meal.  . norgestimate-ethinyl estradiol (SPRINTEC 28) 0.25-35 MG-MCG tablet Take 1 tablet by mouth daily.  . SUMAtriptan (IMITREX) 50 MG tablet TAKE 1 TAB AT ONSET OF MIGRAINE. MAY REPEAT IN 2 HOURS IF HEADACHE PERSISTS OR RECURS. MAX OF 2 TABS IN A DAY   No current facility-administered medications on file prior to visit.    Allergies:  Allergies  Allergen Reactions  . Shellfish Allergy Swelling    Social History:  Social History   Socioeconomic History  . Marital status: Single    Spouse name: Not on file  . Number of children: Not on file  . Years of education: Not on file  . Highest education level: Not on file  Occupational History  . Not on file  Tobacco Use  . Smoking status: Former Smoker    Types: Cigarettes  . Smokeless tobacco: Never Used  Substance and Sexual Activity  . Alcohol use: No  . Drug use: No  . Sexual activity: Yes    Birth control/protection: None  Other Topics Concern  . Not on file  Social History Narrative  . Not on file   Social Determinants of Health   Financial Resource Strain:   . Difficulty of Paying Living Expenses:   Food Insecurity:   . Worried About Programme researcher, broadcasting/film/video in the Last Year:   . Barista in the Last Year:   Transportation Needs:   . Freight forwarder (Medical):   Marland Kitchen Lack of Transportation (Non-Medical):   Physical Activity:   . Days of Exercise per Week:   . Minutes of Exercise per Session:   Stress:   . Feeling of Stress :   Social Connections:   . Frequency of Communication with Friends and Family:   . Frequency of Social Gatherings with  Friends and Family:   . Attends Religious Services:   . Active Member of Clubs or Organizations:   . Attends Banker Meetings:   Marland Kitchen Marital Status:   Intimate Partner Violence:   . Fear of Current or Ex-Partner:   . Emotionally Abused:   Marland Kitchen Physically Abused:   . Sexually Abused:    Social History   Tobacco Use  Smoking Status Former Smoker  . Types: Cigarettes  Smokeless Tobacco Never Used   Social History   Substance and Sexual Activity  Alcohol Use No    Family History:  Family History  Problem Relation Age of Onset  . Hypertension Mother   . Sickle cell trait Mother   . Hypertension Daughter        pulmonary  . Cancer Paternal Uncle   . Gout Maternal Grandfather   . Cancer Paternal Uncle   . Cancer Paternal Uncle   . Bladder Cancer Neg Hx   . Kidney cancer Neg Hx     Past medical history, surgical history, medications, allergies, family history and social history reviewed with patient today and changes made to appropriate areas of the chart.   Review of Systems - negative All other ROS negative except what is listed above and in the HPI.      Objective:    BP 98/62   Pulse 66   Temp 98.1 F (36.7 C) (Oral)   Ht 5' 0.2" (1.529 m)   Wt 126 lb (57.2 kg)   LMP 08/19/2019 (Approximate)   BMI 24.44 kg/m   Wt Readings from Last 3 Encounters:  09/04/19 126 lb (57.2 kg)  09/03/19 124 lb (56.2 kg)  08/30/19 124 lb (56.2 kg)    Physical Exam Constitutional:      General: She is awake. She is not in acute distress.    Appearance: She is well-developed. She is not ill-appearing.  HENT:     Head: Normocephalic and atraumatic.     Right Ear: Hearing, tympanic membrane, ear canal and external ear normal. No drainage.     Left Ear: Hearing, tympanic membrane, ear canal and external ear normal. No drainage.     Nose: Nose normal.     Right Sinus: No maxillary sinus tenderness or frontal sinus tenderness.     Left Sinus: No maxillary sinus tenderness  or frontal sinus tenderness.     Mouth/Throat:     Mouth: Mucous membranes are moist.     Pharynx: Oropharynx is clear. Uvula midline. No pharyngeal swelling, oropharyngeal exudate or posterior oropharyngeal erythema.  Eyes:     General: Lids are normal.        Right eye: No discharge.        Left eye: No discharge.     Extraocular Movements: Extraocular movements intact.     Conjunctiva/sclera: Conjunctivae normal.     Pupils: Pupils are equal, round, and reactive to light.     Visual Fields: Right eye visual fields normal and left eye visual fields normal.  Neck:     Thyroid: No thyromegaly.     Vascular: No carotid bruit.     Trachea: Trachea normal.  Cardiovascular:     Rate and Rhythm: Normal rate and regular rhythm.     Heart sounds: Normal heart sounds. No murmur. No gallop.   Pulmonary:     Effort: Pulmonary effort is normal. No accessory muscle usage or respiratory distress.     Breath sounds: Normal breath sounds.  Chest:     Breasts:        Right: Normal.        Left: Normal.  Abdominal:     General: Bowel sounds are normal.     Palpations: Abdomen is soft. There is no hepatomegaly or splenomegaly.     Tenderness: There is no abdominal tenderness.  Genitourinary:    Exam position: Lithotomy position.     Labia:        Right: No rash.        Left: No rash.      Vagina: Normal.     Cervix: Normal.     Uterus: Normal.      Adnexa: Right adnexa normal and left adnexa normal.     Comments: Cervix slightly posterior.  Yeast discharge present -- known and being treated at this time.  Musculoskeletal:        General: Normal range of motion.     Cervical back: Normal range of motion and neck supple.     Right lower leg: No edema.     Left lower leg: No edema.  Lymphadenopathy:     Head:     Right side of head: No submental, submandibular, tonsillar, preauricular or posterior auricular adenopathy.     Left side of head: No submental, submandibular, tonsillar,  preauricular or posterior auricular adenopathy.     Cervical: No cervical adenopathy.     Upper Body:     Right upper body: No supraclavicular, axillary or pectoral adenopathy.     Left upper body: No supraclavicular, axillary or pectoral adenopathy.  Skin:    General: Skin is warm and dry.     Capillary Refill: Capillary refill takes less than 2 seconds.     Findings: No rash.  Neurological:     Mental Status: She is alert and oriented to person, place, and time.     Cranial Nerves: Cranial nerves are intact.     Gait: Gait is intact.     Deep Tendon Reflexes: Reflexes are normal and symmetric.     Reflex Scores:      Brachioradialis reflexes are 2+ on the right side and 2+ on the left side.      Patellar reflexes are 2+ on the right side and 2+ on the left side. Psychiatric:        Attention and Perception: Attention normal.        Mood and Affect: Mood normal.        Speech: Speech normal.        Behavior: Behavior normal. Behavior is cooperative.  Thought Content: Thought content normal.        Judgment: Judgment normal.     Results for orders placed or performed in visit on 09/03/19  Mycoplasma / Ureaplasma Culture   Specimen: Urine   UR  Result Value Ref Range   Ureaplasma urealyticum Comment Negative   Mycoplasma hominis Culture Comment Negative  Microscopic Examination   URINE  Result Value Ref Range   WBC, UA 0-5 0 - 5 /hpf   RBC 0-2 0 - 2 /hpf   Epithelial Cells (non renal) 0-10 0 - 10 /hpf   Bacteria, UA Moderate (A) None seen/Few  Urinalysis, Complete  Result Value Ref Range   Specific Gravity, UA 1.025 1.005 - 1.030   pH, UA 5.0 5.0 - 7.5   Color, UA Yellow Yellow   Appearance Ur Cloudy (A) Clear   Leukocytes,UA Trace (A) Negative   Protein,UA Negative Negative/Trace   Glucose, UA Negative Negative   Ketones, UA Negative Negative   RBC, UA Negative Negative   Bilirubin, UA Negative Negative   Urobilinogen, Ur 0.2 0.2 - 1.0 mg/dL   Nitrite, UA  Negative Negative   Microscopic Examination See below:       Assessment & Plan:   Problem List Items Addressed This Visit      Cardiovascular and Mediastinum   Migraine    Chronic, ongoing.  Stable with use of Naproxen and Imitrex.  Continue this regimen and adjust as needed. Plan to see back in 6 months, sooner if worsening symptoms present.        Other   Shellfish allergy    Repeat shellfish allergy screening.      Relevant Orders   Allergen Profile, Shellfish    Other Visit Diagnoses    Encounter for annual physical exam    -  Primary   Pap obtained today, overall healthy on exam and is self pay-- will hold off on blood work other than what she requested at this time.   Need for Tdap vaccination       Tetanus updated   Relevant Orders   Tdap vaccine greater than or equal to 7yo IM (Completed)   Screening for chlamydial disease       GC/Chlam ordered   Relevant Orders   GC/Chlamydia Probe Amp   Cervical cancer screening       Pap performed   Relevant Orders   Cytology - PAP       Follow up plan: Return in about 6 months (around 03/06/2020) for Migraines.   LABORATORY TESTING:  - Pap smear: pap done  IMMUNIZATIONS:   - Tdap: Tetanus vaccination status reviewed: Td vaccination indicated and given today. - Influenza: Up to date - Pneumovax: Not applicable - Prevnar: Not applicable - HPV: Up to date - Zostavax vaccine: Not applicable  SCREENING: -Mammogram: Not applicable  - Colonoscopy: Not applicable  - Bone Density: Not applicable  -Hearing Test: Not applicable  -Spirometry: Not applicable   PATIENT COUNSELING:   Advised to take 1 mg of folate supplement per day if capable of pregnancy.   Sexuality: Discussed sexually transmitted diseases, partner selection, use of condoms, avoidance of unintended pregnancy  and contraceptive alternatives.   Advised to avoid cigarette smoking.  I discussed with the patient that most people either abstain from  alcohol or drink within safe limits (<=14/week and <=4 drinks/occasion for males, <=7/weeks and <= 3 drinks/occasion for females) and that the risk for alcohol disorders and other health effects rises proportionally with  the number of drinks per week and how often a drinker exceeds daily limits.  Discussed cessation/primary prevention of drug use and availability of treatment for abuse.   Diet: Encouraged to adjust caloric intake to maintain  or achieve ideal body weight, to reduce intake of dietary saturated fat and total fat, to limit sodium intake by avoiding high sodium foods and not adding table salt, and to maintain adequate dietary potassium and calcium preferably from fresh fruits, vegetables, and low-fat dairy products.    stressed the importance of regular exercise  Injury prevention: Discussed safety belts, safety helmets, smoke detector, smoking near bedding or upholstery.   Dental health: Discussed importance of regular tooth brushing, flossing, and dental visits.    NEXT PREVENTATIVE PHYSICAL DUE IN 1 YEAR. Return in about 6 months (around 03/06/2020) for Migraines.

## 2019-09-06 LAB — GC/CHLAMYDIA PROBE AMP
Chlamydia trachomatis, NAA: NEGATIVE
Neisseria Gonorrhoeae by PCR: NEGATIVE

## 2019-09-06 LAB — CYTOLOGY - PAP: Diagnosis: NEGATIVE

## 2019-09-06 NOTE — Progress Notes (Signed)
Contacted via MyChart  Good afternoon Kerryn.  Your Gonorrhea and chlamydia testing returned negative. :)

## 2019-09-06 NOTE — Progress Notes (Signed)
Contacted via MyChart

## 2019-09-09 ENCOUNTER — Other Ambulatory Visit: Payer: Self-pay | Admitting: Nurse Practitioner

## 2019-09-09 LAB — ALLERGEN PROFILE, SHELLFISH
Clam IgE: 3.53 kU/L — AB
F023-IgE Crab: 31.3 kU/L — AB
F080-IgE Lobster: 25.2 kU/L — AB
F290-IgE Oyster: 2.53 kU/L — AB
Scallop IgE: 4.17 kU/L — AB
Shrimp IgE: 23.2 kU/L — AB

## 2019-09-09 LAB — CULTURE, URINE COMPREHENSIVE

## 2019-09-09 LAB — MYCOPLASMA / UREAPLASMA CULTURE
Mycoplasma hominis Culture: NEGATIVE
Ureaplasma urealyticum: NEGATIVE

## 2019-09-09 MED ORDER — VALACYCLOVIR HCL 1 G PO TABS: 1000.0000 mg | ORAL_TABLET | Freq: Two times a day (BID) | ORAL | 0 refills | Status: DC

## 2019-09-09 NOTE — Progress Notes (Signed)
Valtrex script.

## 2019-09-09 NOTE — Progress Notes (Signed)
Contacted via MyChart

## 2019-09-16 ENCOUNTER — Ambulatory Visit
Admission: RE | Admit: 2019-09-16 | Discharge: 2019-09-16 | Disposition: A | Discharge status: Home / Self Care | Payer: Self-pay | Source: Ambulatory Visit | Attending: Urology | Admitting: Urology

## 2019-09-16 ENCOUNTER — Other Ambulatory Visit: Payer: Self-pay

## 2019-09-16 DIAGNOSIS — R3129 Other microscopic hematuria: Secondary | ICD-10-CM | POA: Insufficient documentation

## 2019-09-25 NOTE — Progress Notes (Signed)
° °  09/26/19   CC:  Chief Complaint  Patient presents with   Cysto    HPI: Allison Sanchez is a 24 y.o. F with chronic UTI's and hematuria returns today for cysto.   Reviewing her records: + Staphylococcus hominis resistant to PCN on 07/18/2019 + Staphylococcus aureus resistant to PCN on 06/14/2019 + E. Coli on 02/27/2019  UA on 08/30/2019 3-10 RBC's.  Urine culture was negative.    RUS from 09/16/19 revealed no hydronephrosis or renal calculus. Debris in the urinary bladder, correlate with urinalysis.  UA today suspicious for UTI however she just started her menstrual cycle.  She is having some crampy pelvic pain and discomfort/bloating which may be more related to her cycle than UTI.  She denies UTI type symptoms including dysuria and frequency.   Today's Vitals   09/26/19 0959  BP: 104/64  Pulse: 72   There is no height or weight on file to calculate BMI. NED. A&Ox3.   No respiratory distress   Abd soft, NT, ND Normal external genitalia with patent urethral meatus  Cystoscopy Procedure Note  Patient identification was confirmed, informed consent was obtained, and patient was prepped using Betadine solution.  Lidocaine jelly was administered per urethral meatus.    Procedure: - Flexible cystoscope introduced, without any difficulty.   - Thorough search of the bladder revealed:    normal urethral meatus    normal urothelium    no stones    no ulcers     no tumors    no urethral polyps    no trabeculation    fairly significant stoning on trigone and diffuse cystitis cystica throughout bladder w/o concern for underlying        malignancy   - Ureteral orifices were normal in position and appearance.  Post-Procedure: - Patient tolerated the procedure well  Pertinent Imagings:  CLINICAL DATA:  Microscopic hematuria  EXAM: RENAL / URINARY TRACT ULTRASOUND COMPLETE  COMPARISON:  11/10/2017  FINDINGS: Right Kidney:  Renal measurements: 10.1 x 3.0 x 4.5 cm  = volume: 70.5 mL . Echogenicity within normal limits. No mass or hydronephrosis visualized.  Left Kidney:  Renal measurements: 11.5 x 4.6 x 5.1 cm = volume: 141 mL. Echogenicity within normal limits. No mass or hydronephrosis visualized.  Bladder:  Subtle echogenic debris in the urinary bladder.  Other:  None.  IMPRESSION: 1. No hydronephrosis or renal calculus. 2. Debris in the urinary bladder, correlate with urinalysis.   Electronically Signed   By: Donzetta Kohut M.D.   On: 09/16/2019 12:56  I have personally reviewed the images and agree with radiologist interpretation.   Assessment/ Plan:  1. 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 Cystitis  As above    Return in about 3 months (around 12/27/2019).  Solon Augusta, am acting as a scribe for Dr. Vanna Scotland,  I have reviewed the above documentation for accuracy and completeness, and I agree with the above.   Vanna Scotland, MD

## 2019-09-26 ENCOUNTER — Other Ambulatory Visit: Payer: Self-pay

## 2019-09-26 ENCOUNTER — Encounter: Payer: Self-pay | Admitting: Urology

## 2019-09-26 ENCOUNTER — Ambulatory Visit (INDEPENDENT_AMBULATORY_CARE_PROVIDER_SITE_OTHER): Payer: Self-pay | Admitting: Urology

## 2019-09-26 VITALS — BP 104/64 | HR 72

## 2019-09-26 DIAGNOSIS — R3129 Other microscopic hematuria: Secondary | ICD-10-CM

## 2019-09-26 DIAGNOSIS — N390 Urinary tract infection, site not specified: Secondary | ICD-10-CM

## 2019-09-26 MED ORDER — TRIMETHOPRIM 100 MG PO TABS: 100.0000 mg | ORAL_TABLET | Freq: Every day | ORAL | 1 refills | Status: DC

## 2019-09-27 LAB — URINALYSIS, COMPLETE
Bilirubin, UA: NEGATIVE
Glucose, UA: NEGATIVE
Ketones, UA: NEGATIVE
Leukocytes,UA: NEGATIVE
Nitrite, UA: NEGATIVE
Protein,UA: NEGATIVE
Specific Gravity, UA: 1.02 (ref 1.005–1.030)
Urobilinogen, Ur: 0.2 mg/dL (ref 0.2–1.0)
pH, UA: 5 (ref 5.0–7.5)

## 2019-09-27 LAB — MICROSCOPIC EXAMINATION: RBC, Urine: >30 /hpf — AB (ref 0–2)

## 2019-10-02 ENCOUNTER — Telehealth (INDEPENDENT_AMBULATORY_CARE_PROVIDER_SITE_OTHER): Payer: Self-pay | Admitting: Family Medicine

## 2019-10-02 ENCOUNTER — Encounter: Payer: Self-pay | Admitting: Urology

## 2019-10-02 ENCOUNTER — Encounter: Payer: Self-pay | Admitting: Family Medicine

## 2019-10-02 DIAGNOSIS — J029 Acute pharyngitis, unspecified: Secondary | ICD-10-CM

## 2019-10-02 LAB — CULTURE, URINE COMPREHENSIVE

## 2019-10-02 LAB — MYCOPLASMA / UREAPLASMA CULTURE
Mycoplasma hominis Culture: NEGATIVE
Ureaplasma urealyticum: NEGATIVE

## 2019-10-02 MED ORDER — CEPHALEXIN 500 MG PO CAPS: 500.0000 mg | ORAL_CAPSULE | Freq: Four times a day (QID) | ORAL | 0 refills | Status: DC

## 2019-10-02 MED ORDER — NITROFURANTOIN MONOHYD MACRO 100 MG PO CAPS
100.0000 mg | ORAL_CAPSULE | Freq: Every day | ORAL | 0 refills | Status: DC
Start: 2019-10-02 — End: 2019-12-26

## 2019-10-02 NOTE — Progress Notes (Signed)
There were no vitals taken for this visit.   Subjective:    Patient ID: Allison Sanchez, female    DOB: May 18, 1995, 24 y.o.   MRN: [id number]  HPI: Allison Sanchez is a 24 y.o. female  Chief Complaint  Patient presents with  . Sore Throat    pt states she has a sore throat with swollen tonsils. States she started having pain on the right side of her jaw on Friday and also noticed white spots on her tonsils. States she did have strep 2 months ago and had an abcess on her left tonsil before     . This visit was completed via MyChart due to the restrictions of the COVID-19 pandemic. All issues as above were discussed and addressed. Physical exam was done as above through visual confirmation on MyChart. If it was felt that the patient should be evaluated in the office, they were directed there. The patient verbally consented to this visit. . Location of the patient: home . Location of the provider: work . Those involved with this call:  . Provider: Merrie Roof, PA-C . CMA: Lesle Chris, [place] . Front Desk/Registration: Jill Side  . Time spent on call: 20 minutes with patient face to face via video conference. More than 50% of this time was spent in counseling and coordination of care. 5 minutes total spent in review of patient's record and preparation of their chart. I verified patient identity using two factors (patient name and date of birth). Patient consents verbally to being seen via telemedicine visit today.   Here today for 3-4 days of sore, swollen throat, white spots on tonsils b/l, malaise. Recent hx of strep infection that progressed into peritonsillar abscess. Using salt water gargles and trying to change toothbrush often. Denies dysphagia, cough, SOB, obvious sick contacts. Of note, currently on macrobid for a UTI through Urology which was switched based on culture and sensitivity to keflex this morning. Pt has not yet picked this up.   Relevant past medical, surgical, family  and social history reviewed and updated as indicated. Interim medical history since our last visit reviewed. Allergies and medications reviewed and updated.  Review of Systems  Per HPI unless specifically indicated above     Objective:    There were no vitals taken for this visit.  Wt Readings from Last 3 Encounters:  09/04/19 126 lb (57.2 kg)  09/03/19 124 lb (56.2 kg)  08/30/19 124 lb (56.2 kg)    Physical Exam Vitals and nursing note reviewed.  Constitutional:      General: She is not in acute distress.    Appearance: Normal appearance.  HENT:     Head: Atraumatic.     Right Ear: External ear normal.     Left Ear: External ear normal.     Nose: Nose normal. No congestion.     Mouth/Throat:     Mouth: Mucous membranes are moist.     Pharynx: Oropharyngeal exudate and posterior oropharyngeal erythema present.  Eyes:     Extraocular Movements: Extraocular movements intact.     Conjunctiva/sclera: Conjunctivae normal.  Cardiovascular:     Comments: Unable to assess via virtual visit Pulmonary:     Effort: Pulmonary effort is normal. No respiratory distress.  Musculoskeletal:        General: Normal range of motion.     Cervical back: Normal range of motion.  Skin:    General: Skin is dry.     Findings: No erythema.  Neurological:  Mental Status: She is alert and oriented to person, place, and time.  Psychiatric:        Mood and Affect: Mood normal.        Thought Content: Thought content normal.        Judgment: Judgment normal.     Results for orders placed or performed in visit on 09/26/19  CULTURE, URINE COMPREHENSIVE   Specimen: Urine   UR  Result Value Ref Range   Urine Culture, Comprehensive Final report (A)    Organism ID, Bacteria Escherichia coli (A)    Organism ID, Bacteria Comment (A)    ANTIMICROBIAL SUSCEPTIBILITY Comment   Mycoplasma / Ureaplasma Culture   Specimen: Urine   UR  Result Value Ref Range   Ureaplasma urealyticum Negative  Negative   Mycoplasma hominis Culture Negative Negative  Microscopic Examination   URINE  Result Value Ref Range   WBC, UA 6-10 (A) 0 - 5 /hpf   RBC >30 (A) 0 - 2 /hpf   Epithelial Cells (non renal) 0-10 0 - 10 /hpf   Renal Epithel, UA 0-10 (A) None seen /hpf   Bacteria, UA Many (A) None seen/Few  Urinalysis, Complete  Result Value Ref Range   Specific Gravity, UA 1.020 1.005 - 1.030   pH, UA 5.0 5.0 - 7.5   Color, UA Yellow Yellow   Appearance Ur Cloudy (A) Clear   Leukocytes,UA Negative Negative   Protein,UA Negative Negative/Trace   Glucose, UA Negative Negative   Ketones, UA Negative Negative   RBC, UA 3+ (A) Negative   Bilirubin, UA Negative Negative   Urobilinogen, Ur 0.2 0.2 - 1.0 mg/dL   Nitrite, UA Negative Negative   Microscopic Examination See below:       Assessment & Plan:   Problem List Items Addressed This Visit    None    Visit Diagnoses    Pharyngitis, unspecified etiology    -  Primary   Consistent with another strep infection, which should clear up with keflex given for UTI. salt water gargles, pain relievers. F/u if not improving       Follow up plan: Return if symptoms worsen or fail to improve.

## 2019-10-02 NOTE — Telephone Encounter (Addendum)
Patient informed, sent in RX-reviewed in detail instructions. Voiced understanding.   ----- Message from Vanna Scotland, MD sent at 10/02/2019 12:24 PM EDT ----- The bacteria that you grew was actually resistant to Bactrim which is the suppressive antibiotic that I placed you on.  I would like to change her antibiotics, Keflex 500 mg 4 times a day for 5 days and then transition to nitrofurantion 100 mg daily x 90 days.

## 2019-10-03 ENCOUNTER — Ambulatory Visit: Payer: Self-pay | Admitting: Nurse Practitioner

## 2019-10-04 ENCOUNTER — Ambulatory Visit: Payer: Self-pay | Admitting: Unknown Physician Specialty

## 2019-12-26 ENCOUNTER — Ambulatory Visit (INDEPENDENT_AMBULATORY_CARE_PROVIDER_SITE_OTHER): Payer: Self-pay | Admitting: Unknown Physician Specialty

## 2019-12-26 ENCOUNTER — Other Ambulatory Visit: Payer: Self-pay

## 2019-12-26 ENCOUNTER — Encounter: Payer: Self-pay | Admitting: Unknown Physician Specialty

## 2019-12-26 VITALS — BP 119/72 | HR 67 | Temp 98.5°F | Wt 130.0 lb

## 2019-12-26 DIAGNOSIS — N760 Acute vaginitis: Secondary | ICD-10-CM

## 2019-12-26 DIAGNOSIS — B9689 Other specified bacterial agents as the cause of diseases classified elsewhere: Secondary | ICD-10-CM

## 2019-12-26 DIAGNOSIS — N898 Other specified noninflammatory disorders of vagina: Secondary | ICD-10-CM

## 2019-12-26 LAB — WET PREP FOR TRICH, YEAST, CLUE
Clue Cell Exam: POSITIVE — AB
Trichomonas Exam: NEGATIVE
Yeast Exam: NEGATIVE

## 2019-12-26 MED ORDER — METRONIDAZOLE 500 MG PO TABS: 500.0000 mg | ORAL_TABLET | Freq: Two times a day (BID) | ORAL | 0 refills | Status: DC

## 2019-12-26 NOTE — Assessment & Plan Note (Signed)
Rx for Metronidazole 500 BID for 7 days.  Discussed ETOH.  One time dosing discussed but according to uptodate it is less effective.  She chose BID for days.

## 2019-12-26 NOTE — Patient Instructions (Signed)
Vaginitis Vaginitis is a condition in which the vaginal tissue swells and becomes red (inflamed). This condition is most often caused by a change in the normal balance of bacteria and yeast that live in the vagina. This change causes an overgrowth of certain bacteria or yeast, which causes the inflammation. There are different types of vaginitis, but the most common types are:  Bacterial vaginosis.  Yeast infection (candidiasis).  Trichomoniasis vaginitis. This is a sexually transmitted disease (STD).  Viral vaginitis.  Atrophic vaginitis.  Allergic vaginitis. What are the causes? The cause of this condition depends on the type of vaginitis. It can be caused by:  Bacteria (bacterial vaginosis).  Yeast, which is a fungus (yeast infection).  A parasite (trichomoniasis vaginitis).  A virus (viral vaginitis).  Low hormone levels (atrophic vaginitis). Low hormone levels can occur during pregnancy, breastfeeding, or after menopause.  Irritants, such as bubble baths, scented tampons, and feminine sprays (allergic vaginitis). Other factors can change the normal balance of the yeast and bacteria that live in the vagina. These include:  Antibiotic medicines.  Poor hygiene.  Diaphragms, vaginal sponges, spermicides, birth control pills, and intrauterine devices (IUD).  Sex.  Infection.  Uncontrolled diabetes.  A weakened defense (immune) system. What increases the risk? This condition is more likely to develop in women who:  Smoke.  Use vaginal douches, scented tampons, or scented sanitary pads.  Wear tight-fitting pants.  Wear thong underwear.  Use oral birth control pills or an IUD.  Have sex without a condom.  Have multiple sex partners.  Have an STD.  Frequently use the spermicide nonoxynol-9.  Eat lots of foods high in sugar.  Have uncontrolled diabetes.  Have low estrogen levels.  Have a weakened immune system from an immune disorder or medical  treatment.  Are pregnant or breastfeeding. What are the signs or symptoms? Symptoms vary depending on the cause of the vaginitis. Common symptoms include:  Abnormal vaginal discharge. ? The discharge is white, gray, or yellow with bacterial vaginosis. ? The discharge is thick, white, and cheesy with a yeast infection. ? The discharge is frothy and yellow or greenish with trichomoniasis.  A bad vaginal smell. The smell is fishy with bacterial vaginosis.  Vaginal itching, pain, or swelling.  Sex that is painful.  Pain or burning when urinating. Sometimes there are no symptoms. How is this diagnosed? This condition is diagnosed based on your symptoms and medical history. A physical exam, including a pelvic exam, will also be done. You may also have other tests, including:  Tests to determine the pH level (acidity or alkalinity) of your vagina.  A whiff test, to assess the odor that results when a sample of your vaginal discharge is mixed with a potassium hydroxide solution.  Tests of vaginal fluid. A sample will be examined under a microscope. How is this treated? Treatment varies depending on the type of vaginitis you have. Your treatment may include:  Antibiotic creams or pills to treat bacterial vaginosis and trichomoniasis.  Antifungal medicines, such as vaginal creams or suppositories, to treat a yeast infection.  Medicine to ease discomfort if you have viral vaginitis. Your sexual partner should also be treated.  Estrogen delivered in a cream, pill, suppository, or vaginal ring to treat atrophic vaginitis. If vaginal dryness occurs, lubricants and moisturizing creams may help. You may need to avoid scented soaps, sprays, or douches.  Stopping use of a product that is causing allergic vaginitis. Then using a vaginal cream to treat the symptoms. Follow   these instructions at home: Lifestyle  Keep your genital area clean and dry. Avoid soap, and only rinse the area with  water.  Do not douche or use tampons until your health care provider says it is okay to do so. Use sanitary pads, if needed.  Do not have sex until your health care provider approves. When you can return to sex, practice safe sex and use condoms.  Wipe from front to back. This avoids the spread of bacteria from the rectum to the vagina. General instructions  Take over-the-counter and prescription medicines only as told by your health care provider.  If you were prescribed an antibiotic medicine, take or use it as told by your health care provider. Do not stop taking or using the antibiotic even if you start to feel better.  Keep all follow-up visits as told by your health care provider. This is important. How is this prevented?  Use mild, non-scented products. Do not use things that can irritate the vagina, such as fabric softeners. Avoid the following products if they are scented: ? Feminine sprays. ? Detergents. ? Tampons. ? Feminine hygiene products. ? Soaps or bubble baths.  Let air reach your genital area. ? Wear cotton underwear to reduce moisture buildup. ? Avoid wearing underwear while you sleep. ? Avoid wearing tight pants and underwear or nylons without a cotton panel. ? Avoid wearing thong underwear.  Take off any wet clothing, such as bathing suits, as soon as possible.  Practice safe sex and use condoms. Contact a health care provider if:  You have abdominal pain.  You have a fever.  You have symptoms that last for more than 2-3 days. Get help right away if:  You have a fever and your symptoms suddenly get worse. Summary  Vaginitis is a condition in which the vaginal tissue becomes inflamed.This condition is most often caused by a change in the normal balance of bacteria and yeast that live in the vagina.  Treatment varies depending on the type of vaginitis you have.  Do not douche, use tampons , or have sex until your health care provider approves. When  you can return to sex, practice safe sex and use condoms. This information is not intended to replace advice given to you by your health care provider. Make sure you discuss any questions you have with your health care provider. Document Revised: 03/24/2017 Document Reviewed: 05/17/2016 Elsevier Patient Education  2020 [employer]  

## 2019-12-26 NOTE — Progress Notes (Signed)
BP 119/72   Pulse 67   Temp 98.5 F (36.9 C) (Oral)   Wt 130 lb (59 kg)   SpO2 98%   BMI 25.22 kg/m    Subjective:    Patient ID: Allison Sanchez, female    DOB: January 04, 1996, 24 y.o.   MRN: [id number]  HPI: Allison Sanchez is a 24 y.o. female  Chief Complaint  Patient presents with  . Vaginal Discharge    x about a week   Vaginal Discharge The patient's primary symptoms include vaginal discharge. This is a recurrent problem. The current episode started in the past 7 days. The problem occurs intermittently. The problem has been unchanged. The patient is experiencing no pain. She is not pregnant. Pertinent negatives include no abdominal pain, anorexia, back pain, chills, constipation, diarrhea, discolored urine, dysuria, fever, flank pain, frequency, headaches, hematuria, joint pain, joint swelling, nausea, painful intercourse, rash, sore throat, urgency or vomiting. The vaginal discharge was white, yellow and thick (fishy odor). There has been no bleeding. She has not been passing clots. She has not been passing tissue. Nothing aggravates the symptoms. She has tried nothing for the symptoms. The treatment provided no relief. She is sexually active. No, her partner does not have an STD. She uses oral contraceptives for contraception. Her menstrual history has been regular. Her past medical history is significant for vaginosis. There is no history of an abdominal surgery, a Cesarean section, an ectopic pregnancy or endometriosis.    Relevant past medical, surgical, family and social history reviewed and updated as indicated. Interim medical history since our last visit reviewed. Allergies and medications reviewed and updated.  Review of Systems  Constitutional: Negative for chills and fever.  HENT: Negative for sore throat.   Gastrointestinal: Negative for abdominal pain, anorexia, constipation, diarrhea, nausea and vomiting.  Genitourinary: Positive for vaginal discharge. Negative for  dysuria, flank pain, frequency, hematuria and urgency.  Musculoskeletal: Negative for back pain and joint pain.  Skin: Negative for rash.  Neurological: Negative for headaches.    Per HPI unless specifically indicated above     Objective:    BP 119/72   Pulse 67   Temp 98.5 F (36.9 C) (Oral)   Wt 130 lb (59 kg)   SpO2 98%   BMI 25.22 kg/m   Wt Readings from Last 3 Encounters:  12/26/19 130 lb (59 kg)  09/04/19 126 lb (57.2 kg)  09/03/19 124 lb (56.2 kg)    Physical Exam Constitutional:      General: She is not in acute distress.    Appearance: Normal appearance. She is well-developed.  HENT:     Head: Normocephalic and atraumatic.  Eyes:     General: Lids are normal. No scleral icterus.       Right eye: No discharge.        Left eye: No discharge.     Conjunctiva/sclera: Conjunctivae normal.  Cardiovascular:     Rate and Rhythm: Normal rate.  Pulmonary:     Effort: Pulmonary effort is normal.  Abdominal:     Palpations: There is no hepatomegaly or splenomegaly.  Musculoskeletal:        General: Normal range of motion.  Skin:    Coloration: Skin is not pale.     Findings: No rash.  Neurological:     Mental Status: She is alert and oriented to person, place, and time.  Psychiatric:        Behavior: Behavior normal.  Thought Content: Thought content normal.        Judgment: Judgment normal.    Wet prep: Positive for clue cells     Assessment & Plan:   Problem List Items Addressed This Visit      Unprioritized   Bacterial vaginosis    Rx for Metronidazole 500 BID for 7 days.  Discussed ETOH.  One time dosing discussed but according to uptodate it is less effective.  She chose BID for days.        Relevant Medications   metroNIDAZOLE (FLAGYL) 500 MG tablet    Other Visit Diagnoses    Vaginal discharge    -  Primary   Relevant Orders   WET PREP FOR TRICH, YEAST, CLUE         Follow up plan: Return if symptoms worsen or fail to  improve.

## 2020-01-02 ENCOUNTER — Ambulatory Visit: Payer: Self-pay | Admitting: Urology

## 2020-02-25 ENCOUNTER — Encounter: Payer: Self-pay | Admitting: Nurse Practitioner

## 2020-02-25 ENCOUNTER — Ambulatory Visit (INDEPENDENT_AMBULATORY_CARE_PROVIDER_SITE_OTHER): Payer: Self-pay | Admitting: Nurse Practitioner

## 2020-02-25 ENCOUNTER — Other Ambulatory Visit: Payer: Self-pay

## 2020-02-25 VITALS — BP 91/58 | HR 64 | Temp 98.3°F | Wt 131.6 lb

## 2020-02-25 DIAGNOSIS — G43109 Migraine with aura, not intractable, without status migrainosus: Secondary | ICD-10-CM

## 2020-02-25 DIAGNOSIS — J0391 Acute recurrent tonsillitis, unspecified: Secondary | ICD-10-CM

## 2020-02-25 DIAGNOSIS — N898 Other specified noninflammatory disorders of vagina: Secondary | ICD-10-CM

## 2020-02-25 MED ORDER — METRONIDAZOLE 500 MG PO TABS
500.0000 mg | ORAL_TABLET | Freq: Two times a day (BID) | ORAL | 0 refills | Status: AC
Start: 2020-02-25 — End: 2020-03-03

## 2020-02-25 MED ORDER — AMITRIPTYLINE HCL 10 MG PO TABS: 10.0000 mg | ORAL_TABLET | Freq: Every day | ORAL | 4 refills | Status: DC

## 2020-02-25 MED ORDER — SUMATRIPTAN SUCCINATE 25 MG PO TABS: 25.0000 mg | ORAL_TABLET | ORAL | 0 refills | Status: DC | PRN

## 2020-02-25 NOTE — Progress Notes (Signed)
BP (!) 91/58   Pulse 64   Temp 98.3 F (36.8 C) (Oral)   Wt 131 lb 9.6 oz (59.7 kg)   LMP 02/22/2020   SpO2 96%   BMI 25.53 kg/m    Subjective:    Patient ID: Allison Sanchez, female    DOB: 1995/11/20, 24 y.o.   MRN: [id number]  HPI: Allison Sanchez is a 24 y.o. female  Chief Complaint  Patient presents with  . Headache    on and off for about 2 weeks  . Vaginal Discharge    with odor for about since the end of September.   MIGRAINES Went for long time with using Imitrex as needed + Naproxen -- never took Imitrex.  Over past two weeks headaches have worsened.  More episodes of headaches and presenting with some dizziness.  Naproxen has not been helping as much, Tylenol ES occasionally helps.  Last CT scan of head was unremarkable in December 2020.    She reports feeling like she had strep throat about one month ago, this is her 3rd incidence of this in the past year -- she treated with leftover Amoxicillin.  She would like to go to ENT for assessment and recommendations -- in April had CT scan soft tissue noting tonsillitis Duration: chronic Onset: gradual Severity: 8/10 Quality: throbbing Frequency: intermittent Location: posterior occiput and at times frontal area Headache duration: 20 minutes to over an hour Radiation: no Time of day headache occurs: mid day Alleviating factors: relaxing and Tylenol Extra Strength Aggravating factors: nothing Headache status at time of visit: mild headache at present Treatments attempted: rest, APAP and ibuprofen   Aura: sometimes Nausea:  no Vomiting: no Photophobia:  no Phonophobia:  yes Effect on social functioning:  no Numbers of missed days of school/work each month: none Confusion:  no Gait disturbance/ataxia:  no Behavioral changes:  no Fevers:  no   VAGINAL DISCHARGE Last treated on 12/26/19 -- a week or two later symptoms returned.  Currently on menstrual cycle. Duration: weeks Discharge description: clear   Pruritus: no Dysuria: no Malodorous: yes Urinary frequency: none Fevers: no Abdominal pain: no  Sexual activity: monogamous History of sexually transmitted diseases: no Recent antibiotic use: yes Context: recurrent BV  Treatments attempted: none   Relevant past medical, surgical, family and social history reviewed and updated as indicated. Interim medical history since our last visit reviewed. Allergies and medications reviewed and updated.  Review of Systems  Constitutional: Negative for activity change, appetite change, diaphoresis, fatigue and fever.  Respiratory: Negative for cough, chest tightness and shortness of breath.   Cardiovascular: Negative for chest pain, palpitations and leg swelling.  Gastrointestinal: Negative.   Genitourinary: Positive for vaginal discharge. Negative for dysuria, frequency, hematuria, pelvic pain, urgency and vaginal pain.  Neurological: Positive for dizziness and headaches. Negative for syncope, weakness, light-headedness and numbness.  Psychiatric/Behavioral: Negative.     Per HPI unless specifically indicated above     Objective:    BP (!) 91/58   Pulse 64   Temp 98.3 F (36.8 C) (Oral)   Wt 131 lb 9.6 oz (59.7 kg)   LMP 02/22/2020   SpO2 96%   BMI 25.53 kg/m   Wt Readings from Last 3 Encounters:  02/25/20 131 lb 9.6 oz (59.7 kg)  12/26/19 130 lb (59 kg)  09/04/19 126 lb (57.2 kg)    Physical Exam Vitals and nursing note reviewed.  Constitutional:      General: She is awake. She is  not in acute distress.    Appearance: She is well-developed and well-groomed. She is not ill-appearing.  HENT:     Head: Normocephalic.     Right Ear: Hearing, tympanic membrane, ear canal and external ear normal.     Left Ear: Hearing, tympanic membrane, ear canal and external ear normal.  Eyes:     General: Lids are normal.        Right eye: No discharge.        Left eye: No discharge.     Extraocular Movements: Extraocular movements intact.      Conjunctiva/sclera: Conjunctivae normal.     Pupils: Pupils are equal, round, and reactive to light.  Neck:     Thyroid: No thyromegaly.     Vascular: No carotid bruit.  Cardiovascular:     Rate and Rhythm: Normal rate and regular rhythm.     Heart sounds: Normal heart sounds. No murmur heard.  No gallop.   Pulmonary:     Effort: Pulmonary effort is normal. No accessory muscle usage or respiratory distress.     Breath sounds: Normal breath sounds.  Abdominal:     General: Bowel sounds are normal. There is no distension.     Palpations: Abdomen is soft. There is no hepatomegaly or splenomegaly.     Tenderness: There is no abdominal tenderness. There is no right CVA tenderness or left CVA tenderness.  Musculoskeletal:     Cervical back: Normal range of motion and neck supple.     Right lower leg: No edema.     Left lower leg: No edema.  Skin:    General: Skin is warm and dry.  Neurological:     Mental Status: She is alert and oriented to person, place, and time.  Psychiatric:        Attention and Perception: Attention normal.        Mood and Affect: Mood normal.        Speech: Speech normal.        Behavior: Behavior normal. Behavior is cooperative.        Thought Content: Thought content normal.    Results for orders placed or performed in visit on 12/26/19  WET PREP FOR TRICH, YEAST, CLUE   Specimen: Vaginal; Sterile Swab   Sterile Swab  Result Value Ref Range   Trichomonas Exam Negative Negative   Yeast Exam Negative Negative   Clue Cell Exam Positive (A) Negative      Assessment & Plan:   Problem List Items Addressed This Visit      Cardiovascular and Mediastinum   Migraine - Primary    Chronic, ongoing with increase HA reported recently.  Will start Amitriptyline, script sent and educated patient on medication.  Start at 10 MG QHS.  Refills on Imitrex sent to use as needed only, can continue use of Tylenol or Naproxen as needed if prefers this.  Last CT imaging  in December 2020, will defer repeat at this time unless worsening.  No red flag symptoms on HPI or exam.  Return in 4 weeks for follow-up, recommend keeping headache diary and bringing to visit.      Relevant Medications   amitriptyline (ELAVIL) 10 MG tablet   SUMAtriptan (IMITREX) 25 MG tablet     Other   Vaginal discharge    Acute -- UA + BLD (menstrual cycle), +LEUK, and + NIT -- will send for culture and determine need for treatment upon return and abx to treat with.  Wet prep +  for clue cells, neg trich and yeast.  Last BV infection 12/26/19 -- will start Flagyl 500 MG BID x 7 days which she tolerated well last visit.  Discussed at length with patient and she is in agreeance with plan.  She is aware PCP will reach out via MyChart with urine culture and any further steps needed.  Recommend increased hydration at home and ensuring partner does good hand washing prior to tactile stimulation with intercourse.  Return to office for worsening or ongoing symptoms.      Relevant Orders   UA/M w/rflx Culture, Routine   WET PREP FOR TRICH, YEAST, CLUE    Other Visit Diagnoses    Recurrent tonsillitis       Referral to ENT placed per patient request, due to recurrence this year of episodes.   Relevant Orders   Ambulatory referral to ENT       Follow up plan: Return in about 4 weeks (around 03/24/2020) for Headache.

## 2020-02-25 NOTE — Assessment & Plan Note (Signed)
Acute -- UA + BLD (menstrual cycle), +LEUK, and + NIT -- will send for culture and determine need for treatment upon return and abx to treat with.  Wet prep + for clue cells, neg trich and yeast.  Last BV infection 12/26/19 -- will start Flagyl 500 MG BID x 7 days which she tolerated well last visit.  Discussed at length with patient and she is in agreeance with plan.  She is aware PCP will reach out via MyChart with urine culture and any further steps needed.  Recommend increased hydration at home and ensuring partner does good hand washing prior to tactile stimulation with intercourse.  Return to office for worsening or ongoing symptoms.

## 2020-02-25 NOTE — Assessment & Plan Note (Signed)
Chronic, ongoing with increase HA reported recently.  Will start Amitriptyline, script sent and educated patient on medication.  Start at 10 MG QHS.  Refills on Imitrex sent to use as needed only, can continue use of Tylenol or Naproxen as needed if prefers this.  Last CT imaging in December 2020, will defer repeat at this time unless worsening.  No red flag symptoms on HPI or exam.  Return in 4 weeks for follow-up, recommend keeping headache diary and bringing to visit.

## 2020-02-25 NOTE — Patient Instructions (Signed)
Form - Headache Record There are many types and causes of headaches. A headache record can help guide your treatment plan. Use this form to record the details. Bring this form with you to your follow-up visits. Follow your health care provider's instructions on how to describe your headache. You may be asked to:  Use a pain scale. This is a tool to rate the intensity of your headache using words or numbers.  Describe what your headache feels like, such as dull, achy, throbbing, or sharp. Headache record Date: _______________ Time (from start to end): ____________________ Location of the headache: _________________________  Intensity of the headache: ____________________ Description of the headache: ______________________________________________________________  Hours of sleep the night before the headache: __________  Food or drinks before the headache started: ______________________________________________________________________________________  Events before the headache started: _______________________________________________________________________________________________  Symptoms before the headache started: __________________________________________________________________________________________  Symptoms during the headache: __________________________________________________________________________________________________  Treatment: ________________________________________________________________________________________________________________  Effect of treatment: _________________________________________________________________________________________________________  Other comments: ___________________________________________________________________________________________________________ Date: _______________ Time (from start to end): ____________________ Location of the headache: _________________________  Intensity of the headache: ____________________ Description of the  headache: ______________________________________________________________  Hours of sleep the night before the headache: __________  Food or drinks before the headache started: ______________________________________________________________________________________  Events before the headache started: ____________________________________________________________________________________________  Symptoms before the headache started: _________________________________________________________________________________________  Symptoms during the headache: _______________________________________________________________________________________________  Treatment: ________________________________________________________________________________________________________________  Effect of treatment: _________________________________________________________________________________________________________  Other comments: ___________________________________________________________________________________________________________ Date: _______________ Time (from start to end): ____________________ Location of the headache: _________________________  Intensity of the headache: ____________________ Description of the headache: ______________________________________________________________  Hours of sleep the night before the headache: __________  Food or drinks before the headache started: ______________________________________________________________________________________  Events before the headache started: ____________________________________________________________________________________________  Symptoms before the headache started: _________________________________________________________________________________________  Symptoms during the headache: _______________________________________________________________________________________________  Treatment:  ________________________________________________________________________________________________________________  Effect of treatment: _________________________________________________________________________________________________________  Other comments: ___________________________________________________________________________________________________________ Date: _______________ Time (from start to end): ____________________ Location of the headache: _________________________  Intensity of the headache: ____________________ Description of the headache: ______________________________________________________________  Hours of sleep the night before the headache: _________  Food or drinks before the headache started: ______________________________________________________________________________________  Events before the headache started: ____________________________________________________________________________________________  Symptoms before the headache started: _________________________________________________________________________________________  Symptoms during the headache: _______________________________________________________________________________________________  Treatment: ________________________________________________________________________________________________________________  Effect of treatment: _________________________________________________________________________________________________________  Other comments: ___________________________________________________________________________________________________________ Date: _______________ Time (from start to end): ____________________ Location of the headache: _________________________  Intensity of the headache: ____________________ Description of the headache: ______________________________________________________________  Hours of sleep the night before the headache: _________  Food or drinks  before the headache started: ______________________________________________________________________________________  Events before the headache started: ____________________________________________________________________________________________  Symptoms before the headache started: _________________________________________________________________________________________  Symptoms during the headache: _______________________________________________________________________________________________  Treatment: ________________________________________________________________________________________________________________  Effect of treatment: _________________________________________________________________________________________________________  Other comments: ___________________________________________________________________________________________________________ This information is not intended to replace advice given to you by your health care provider. Make sure you discuss any questions you have with your health care provider. Document Revised: 04/30/2018 Document Reviewed: 04/30/2018 Elsevier Patient Education  2020 [employer]  

## 2020-02-26 LAB — WET PREP FOR TRICH, YEAST, CLUE
Clue Cell Exam: POSITIVE — AB
Trichomonas Exam: NEGATIVE
Yeast Exam: NEGATIVE

## 2020-03-05 LAB — MICROSCOPIC EXAMINATION

## 2020-03-05 LAB — UA/M W/RFLX CULTURE, ROUTINE
Bilirubin, UA: NEGATIVE
Glucose, UA: NEGATIVE
Ketones, UA: NEGATIVE
Nitrite, UA: POSITIVE — AB
Protein,UA: NEGATIVE
Specific Gravity, UA: 1.02 (ref 1.005–1.030)
Urobilinogen, Ur: 0.2 mg/dL (ref 0.2–1.0)
pH, UA: 6 (ref 5.0–7.5)

## 2020-03-05 LAB — URINE CULTURE, REFLEX

## 2020-03-11 ENCOUNTER — Other Ambulatory Visit: Payer: Self-pay | Admitting: Nurse Practitioner

## 2020-03-11 MED ORDER — METRONIDAZOLE 0.75 % VA GEL: 1.0000 | Freq: Every day | VAGINAL | 0 refills | Status: AC

## 2020-04-01 ENCOUNTER — Ambulatory Visit (INDEPENDENT_AMBULATORY_CARE_PROVIDER_SITE_OTHER): Payer: Self-pay | Admitting: Nurse Practitioner

## 2020-04-01 ENCOUNTER — Other Ambulatory Visit: Payer: Self-pay

## 2020-04-01 ENCOUNTER — Encounter: Payer: Self-pay | Admitting: Nurse Practitioner

## 2020-04-01 DIAGNOSIS — G43109 Migraine with aura, not intractable, without status migrainosus: Secondary | ICD-10-CM

## 2020-04-01 NOTE — Assessment & Plan Note (Signed)
Chronic, stable and improved at this time.  Can continue use of Tylenol or Naproxen as needed + Imitrex as needed.  Discussed with patient and will leave Amitriptyline on hand and if increased migraines present recommend she trial this at night.  Last CT imaging in December 2020, will defer repeat at this time unless worsening.  No red flag symptoms on HPI or exam.  Return in May 2022 for annual physical.

## 2020-04-01 NOTE — Patient Instructions (Signed)

## 2020-04-01 NOTE — Progress Notes (Signed)
BP 99/65   Pulse 76   Temp 98.3 F (36.8 C) (Oral)   Ht 5' 1.14" (1.553 m)   Wt 128 lb 9.6 oz (58.3 kg)   SpO2 98%   BMI 24.19 kg/m    Subjective:    Patient ID: Allison Sanchez, female    DOB: 05/06/1995, 24 y.o.   MRN: [id number]  HPI: Allison Sanchez is a 24 y.o. female  Chief Complaint  Patient presents with  . Headache    follow up, has not had since last visit, has not used the medication that was prescrbed at last visit yet, has not needed it   MIGRAINES Started Amitriptyline last visit, but she never had to take, is keeping on hand for future.  Went for long time with using Imitrex as needed + Naproxen -- never took Imitrex.  She reports overall improvement in headaches at this time with no need for Amitriptyline. Last CT scan of head was unremarkable in December 2020.   Duration: chronic Onset: gradual Severity: none at this time Quality: throbbing Frequency: intermittent Location: posterior occiput and at times frontal area Headache duration: 20 minutes to over an hour Radiation: no Time of day headache occurs: mid day Alleviating factors: relaxing and Tylenol Extra Strength Aggravating factors: nothing Headache status at time of visit: mild headache at present Treatments attempted: rest, APAP and ibuprofen   Aura: sometimes Nausea:  no Vomiting: no Photophobia:  no Phonophobia:  yes Effect on social functioning:  no Numbers of missed days of school/work each month: none Confusion:  no Gait disturbance/ataxia:  no Behavioral changes:  no Fevers:  no   Relevant past medical, surgical, family and social history reviewed and updated as indicated. Interim medical history since our last visit reviewed. Allergies and medications reviewed and updated.  Review of Systems  Constitutional: Negative for activity change, appetite change, diaphoresis, fatigue and fever.  Respiratory: Negative for cough, chest tightness and shortness of breath.   Cardiovascular:  Negative for chest pain, palpitations and leg swelling.  Gastrointestinal: Negative.   Neurological: Negative for dizziness, syncope, weakness, light-headedness, numbness and headaches.  Psychiatric/Behavioral: Negative.     Per HPI unless specifically indicated above     Objective:    BP 99/65   Pulse 76   Temp 98.3 F (36.8 C) (Oral)   Ht 5' 1.14" (1.553 m)   Wt 128 lb 9.6 oz (58.3 kg)   SpO2 98%   BMI 24.19 kg/m   Wt Readings from Last 3 Encounters:  04/01/20 128 lb 9.6 oz (58.3 kg)  02/25/20 131 lb 9.6 oz (59.7 kg)  12/26/19 130 lb (59 kg)    Physical Exam Vitals and nursing note reviewed.  Constitutional:      General: She is awake. She is not in acute distress.    Appearance: She is well-developed and well-groomed. She is not ill-appearing.  HENT:     Head: Normocephalic.     Right Ear: Hearing, tympanic membrane, ear canal and external ear normal.     Left Ear: Hearing, tympanic membrane, ear canal and external ear normal.  Eyes:     General: Lids are normal.        Right eye: No discharge.        Left eye: No discharge.     Extraocular Movements: Extraocular movements intact.     Conjunctiva/sclera: Conjunctivae normal.     Pupils: Pupils are equal, round, and reactive to light.  Neck:     Thyroid:  No thyromegaly.     Vascular: No carotid bruit.  Cardiovascular:     Rate and Rhythm: Normal rate and regular rhythm.     Heart sounds: Normal heart sounds. No murmur heard.  No gallop.   Pulmonary:     Effort: Pulmonary effort is normal. No accessory muscle usage or respiratory distress.     Breath sounds: Normal breath sounds.  Abdominal:     General: Bowel sounds are normal.     Palpations: Abdomen is soft.  Musculoskeletal:     Cervical back: Normal range of motion and neck supple.     Right lower leg: No edema.     Left lower leg: No edema.  Skin:    General: Skin is warm and dry.  Neurological:     Mental Status: She is alert and oriented to  person, place, and time.  Psychiatric:        Attention and Perception: Attention normal.        Mood and Affect: Mood normal.        Speech: Speech normal.        Behavior: Behavior normal. Behavior is cooperative.        Thought Content: Thought content normal.    Results for orders placed or performed in visit on 02/25/20  WET PREP FOR TRICH, YEAST, CLUE   Specimen: Vaginal Fluid   Vaginal Flui  Result Value Ref Range   Trichomonas Exam Negative Negative   Yeast Exam Negative Negative   Clue Cell Exam Positive (A) Negative  Microscopic Examination   Urine  Result Value Ref Range   WBC, UA 6-10 (A) 0 - 5 /hpf   RBC 3-10 (A) 0 - 2 /hpf   Epithelial Cells (non renal) 0-10 0 - 10 /hpf   Bacteria, UA Many (A) None seen/Few  Urine Culture, Reflex   Urine  Result Value Ref Range   Urine Culture, Routine CANCELED (A)   UA/M w/rflx Culture, Routine   Specimen: Urine   Urine  Result Value Ref Range   Specific Gravity, UA 1.020 1.005 - 1.030   pH, UA 6.0 5.0 - 7.5   Color, UA Yellow Yellow   Appearance Ur Hazy (A) Clear   Leukocytes,UA Trace (A) Negative   Protein,UA Negative Negative/Trace   Glucose, UA Negative Negative   Ketones, UA Negative Negative   RBC, UA 2+ (A) Negative   Bilirubin, UA Negative Negative   Urobilinogen, Ur 0.2 0.2 - 1.0 mg/dL   Nitrite, UA Positive (A) Negative   Microscopic Examination See below:    Urinalysis Reflex Comment       Assessment & Plan:   Problem List Items Addressed This Visit      Cardiovascular and Mediastinum   Migraine    Chronic, stable and improved at this time.  Can continue use of Tylenol or Naproxen as needed + Imitrex as needed.  Discussed with patient and will leave Amitriptyline on hand and if increased migraines present recommend she trial this at night.  Last CT imaging in December 2020, will defer repeat at this time unless worsening.  No red flag symptoms on HPI or exam.  Return in May 2022 for annual physical.           Follow up plan: Return in about 5 months (around 09/10/2020) for Annual physical.

## 2020-04-02 ENCOUNTER — Encounter: Payer: Self-pay | Admitting: Urology

## 2020-04-02 NOTE — Telephone Encounter (Signed)
Please advise 

## 2020-04-02 NOTE — Telephone Encounter (Signed)
Called patient to schedule appointment. Voiced understanding, aware of time and date.

## 2020-04-06 ENCOUNTER — Other Ambulatory Visit: Payer: Self-pay

## 2020-04-06 ENCOUNTER — Encounter: Payer: Self-pay | Admitting: Nurse Practitioner

## 2020-04-06 ENCOUNTER — Ambulatory Visit (INDEPENDENT_AMBULATORY_CARE_PROVIDER_SITE_OTHER): Payer: Self-pay | Admitting: Nurse Practitioner

## 2020-04-06 VITALS — BP 109/68 | HR 62 | Temp 98.4°F | Ht 60.75 in | Wt 127.4 lb

## 2020-04-06 DIAGNOSIS — F419 Anxiety disorder, unspecified: Secondary | ICD-10-CM | POA: Insufficient documentation

## 2020-04-06 DIAGNOSIS — R002 Palpitations: Secondary | ICD-10-CM

## 2020-04-06 MED ORDER — ESCITALOPRAM OXALATE 10 MG PO TABS: 10.0000 mg | ORAL_TABLET | Freq: Every day | ORAL | 5 refills | Status: DC

## 2020-04-06 NOTE — Progress Notes (Signed)
BP 109/68   Pulse 62   Temp 98.4 F (36.9 C) (Oral)   Ht 5' 0.75" (1.543 m)   Wt 127 lb 6.4 oz (57.8 kg)   SpO2 98%   BMI 24.27 kg/m    Subjective:    Patient ID: Allison Sanchez, female    DOB: 1996/03/28, 24 y.o.   MRN: [id number]  HPI: Allison Sanchez is a 24 y.o. female  Chief Complaint  Patient presents with  . Headache  . heart fluttering   PALPITATIONS Reports intermittent palpitations in past, which have increased recently over past two days.  Is having headache with this and some dizziness on occasion with these palpitations, has some increased fatigue.  She does endorse some increase anxiety, this started after her headaches.  She feels the anxiety is what makes the flutters happen.  Her friend has similar issue and takes Lexapro, which she is interested in taking.  When she is getting "worked up" that is when she notices symptoms more, today they started at work. Duration: days Symptom description: flutter Duration of episode: seconds Frequency: recurrentl Activity when event occurred: varies -- rest or during episodes of anxiety Related to exertion: no Dyspnea: no Chest pain: none Syncope: no Anxiety/stress: yes Nausea/vomiting: no Diaphoresis: no Coronary artery disease: no Congestive heart failure: no Arrhythmia:no Thyroid disease: no Caffeine intake: 2-3 soda a day Status:  fluctuating Treatments attempted:none GAD 7 : Generalized Anxiety Score 04/06/2020  Nervous, Anxious, on Edge 2  Control/stop worrying 2  Worry too much - different things 2  Trouble relaxing 2  Restless 2  Easily annoyed or irritable 2  Afraid - awful might happen 3  Total GAD 7 Score 15  Anxiety Difficulty Very difficult   Depression screen [facility] 2/9 04/06/2020 09/04/2019 04/23/2019 05/04/2017  Decreased Interest 1 0 1 0  Down, Depressed, Hopeless 1 0 1 0  PHQ - 2 Score 2 0 2 0  Altered sleeping 1 - 0 -  Tired, decreased energy 1 - 1 -  Change in appetite 1 - 0 -  Feeling  bad or failure about yourself  1 - 1 -  Trouble concentrating 1 - 1 -  Moving slowly or fidgety/restless 0 - 0 -  Suicidal thoughts 0 - 0 -  PHQ-9 Score 7 - 5 -  Difficult doing work/chores Somewhat difficult - - -   Relevant past medical, surgical, family and social history reviewed and updated as indicated. Interim medical history since our last visit reviewed. Allergies and medications reviewed and updated.  Review of Systems  Constitutional: Negative for activity change, appetite change, diaphoresis, fatigue and fever.  Respiratory: Negative for cough, chest tightness and shortness of breath.   Cardiovascular: Positive for palpitations. Negative for chest pain and leg swelling.  Endocrine: Negative for cold intolerance, heat intolerance, polydipsia, polyphagia and polyuria.  Neurological: Positive for dizziness and headaches. Negative for syncope, facial asymmetry, speech difficulty, weakness, light-headedness and numbness.  Psychiatric/Behavioral: Positive for decreased concentration. Negative for self-injury, sleep disturbance and suicidal ideas. The patient is nervous/anxious.     Per HPI unless specifically indicated above     Objective:    BP 109/68   Pulse 62   Temp 98.4 F (36.9 C) (Oral)   Ht 5' 0.75" (1.543 m)   Wt 127 lb 6.4 oz (57.8 kg)   SpO2 98%   BMI 24.27 kg/m   Wt Readings from Last 3 Encounters:  04/06/20 127 lb 6.4 oz (57.8 kg)  04/01/20 128  lb 9.6 oz (58.3 kg)  02/25/20 131 lb 9.6 oz (59.7 kg)    Physical Exam Vitals and nursing note reviewed.  Constitutional:      General: She is awake. She is not in acute distress.    Appearance: She is well-developed and well-groomed. She is not ill-appearing.  HENT:     Head: Normocephalic.     Right Ear: Hearing, tympanic membrane, ear canal and external ear normal.     Left Ear: Hearing, tympanic membrane, ear canal and external ear normal.  Eyes:     General: Lids are normal.        Right eye: No  discharge.        Left eye: No discharge.     Extraocular Movements: Extraocular movements intact.     Conjunctiva/sclera: Conjunctivae normal.     Pupils: Pupils are equal, round, and reactive to light.  Neck:     Thyroid: No thyromegaly.     Vascular: No carotid bruit.  Cardiovascular:     Rate and Rhythm: Normal rate and regular rhythm.     Heart sounds: Normal heart sounds. No murmur heard. No gallop.   Pulmonary:     Effort: Pulmonary effort is normal. No accessory muscle usage or respiratory distress.     Breath sounds: Normal breath sounds.  Abdominal:     General: Bowel sounds are normal.     Palpations: Abdomen is soft.  Musculoskeletal:     Cervical back: Normal range of motion and neck supple.     Right lower leg: No edema.     Left lower leg: No edema.  Skin:    General: Skin is warm and dry.  Neurological:     Mental Status: She is alert and oriented to person, place, and time.  Psychiatric:        Attention and Perception: Attention normal.        Mood and Affect: Mood normal.        Speech: Speech normal.        Behavior: Behavior normal. Behavior is cooperative.        Thought Content: Thought content normal.    EKG in office, NSR rate 68, no PVC or PAC noted on this EKG.  Slightly shortened PR interval, normal axis.  Results for orders placed or performed in visit on 02/25/20  WET PREP FOR TRICH, YEAST, CLUE   Specimen: Vaginal Fluid   Vaginal Flui  Result Value Ref Range   Trichomonas Exam Negative Negative   Yeast Exam Negative Negative   Clue Cell Exam Positive (A) Negative  Microscopic Examination   Urine  Result Value Ref Range   WBC, UA 6-10 (A) 0 - 5 /hpf   RBC 3-10 (A) 0 - 2 /hpf   Epithelial Cells (non renal) 0-10 0 - 10 /hpf   Bacteria, UA Many (A) None seen/Few  Urine Culture, Reflex   Urine  Result Value Ref Range   Urine Culture, Routine CANCELED (A)   UA/M w/rflx Culture, Routine   Specimen: Urine   Urine  Result Value Ref Range    Specific Gravity, UA 1.020 1.005 - 1.030   pH, UA 6.0 5.0 - 7.5   Color, UA Yellow Yellow   Appearance Ur Hazy (A) Clear   Leukocytes,UA Trace (A) Negative   Protein,UA Negative Negative/Trace   Glucose, UA Negative Negative   Ketones, UA Negative Negative   RBC, UA 2+ (A) Negative   Bilirubin, UA Negative Negative   Urobilinogen,  Ur 0.2 0.2 - 1.0 mg/dL   Nitrite, UA Positive (A) Negative   Microscopic Examination See below:    Urinalysis Reflex Comment       Assessment & Plan:   Problem List Items Addressed This Visit      Other   Palpitations    Ongoing with recent worsening with increased anxiety.  Refer to anxiety plan.  EKG in office overall reassuring -- discussed with patient.  She wishes to hold off on cardiology visit at this time and try medication for anxiety first, will send in script for Lexapro.  Plan on return in 4 weeks for follow-up, sooner if worsening symptoms -- if worsening send to cardiology for assessment.      Relevant Orders   EKG 12-Lead (Completed)   Anxiety - Primary    Ongoing with some worsening.  Denies SI/HI.  GAD7 - 15 and PHQ9 - 7.  Does endorse more headaches, palpitations, and dizziness with anxiety.  Will trial Lexapro at low dose, 10 MG daily.  Script sent for 30 tablets and 4 refills.  Recommend she take this in morning and her Amitriptyline in evening for her migraines, do not take together.  If worsening symptoms she is to return to office immediately and will reassess.  Return in 4 weeks.      Relevant Medications   escitalopram (LEXAPRO) 10 MG tablet       Follow up plan: Return in about 4 weeks (around 05/04/2020) for Anxiety.

## 2020-04-06 NOTE — Assessment & Plan Note (Addendum)
Ongoing with some worsening.  Denies SI/HI.  GAD7 - 15 and PHQ9 - 7.  Does endorse more headaches, palpitations, and dizziness with anxiety.  Will trial Lexapro at low dose, 10 MG daily.  Script sent for 30 tablets and 4 refills.  Recommend she take this in morning and her Amitriptyline in evening for her migraines, do not take together.  If worsening symptoms she is to return to office immediately and will reassess.  Plan on labs check next visit TSH, CBC, BMP.  Return in 4 weeks.

## 2020-04-06 NOTE — Assessment & Plan Note (Signed)
Ongoing with recent worsening with increased anxiety.  Refer to anxiety plan.  EKG in office overall reassuring -- discussed with patient.  She wishes to hold off on cardiology visit at this time and try medication for anxiety first, will send in script for Lexapro.  Plan on return in 4 weeks for follow-up, sooner if worsening symptoms -- if worsening send to cardiology for assessment.

## 2020-04-06 NOTE — Patient Instructions (Signed)

## 2020-04-22 ENCOUNTER — Ambulatory Visit (INDEPENDENT_AMBULATORY_CARE_PROVIDER_SITE_OTHER): Payer: Self-pay | Admitting: Physician Assistant

## 2020-04-22 ENCOUNTER — Encounter: Payer: Self-pay | Admitting: Physician Assistant

## 2020-04-22 ENCOUNTER — Other Ambulatory Visit: Payer: Self-pay

## 2020-04-22 VITALS — BP 107/70 | HR 76 | Ht 60.0 in | Wt 125.6 lb

## 2020-04-22 DIAGNOSIS — R3129 Other microscopic hematuria: Secondary | ICD-10-CM

## 2020-04-22 DIAGNOSIS — R300 Dysuria: Secondary | ICD-10-CM

## 2020-04-22 LAB — URINALYSIS, COMPLETE
Bilirubin, UA: NEGATIVE
Glucose, UA: NEGATIVE
Ketones, UA: NEGATIVE
Leukocytes,UA: NEGATIVE
Nitrite, UA: POSITIVE — AB
Protein,UA: NEGATIVE
Specific Gravity, UA: 1.025 (ref 1.005–1.030)
Urobilinogen, Ur: 0.2 mg/dL (ref 0.2–1.0)
pH, UA: 5 (ref 5.0–7.5)

## 2020-04-22 LAB — MICROSCOPIC EXAMINATION

## 2020-04-22 MED ORDER — AMOXICILLIN-POT CLAVULANATE 875-125 MG PO TABS: 1.0000 | ORAL_TABLET | Freq: Two times a day (BID) | ORAL | 0 refills | Status: AC

## 2020-04-22 MED ORDER — NITROFURANTOIN MONOHYD MACRO 100 MG PO CAPS: 100.0000 mg | ORAL_CAPSULE | Freq: Every day | ORAL | 2 refills | Status: DC

## 2020-04-22 NOTE — Patient Instructions (Signed)
Start Augmentin today for treatment of a UTI. The day after you complete Augmentin, start a 12-month course of Macrobid (nitrofurantoin) to prevent infection and reduce bladder inflammation.

## 2020-04-22 NOTE — Progress Notes (Signed)
04/22/2020 3:55 PM   Allison Sanchez 1995-10-11 [id number]  CC: Chief Complaint  Patient presents with  . Dysuria    HPI: Allison Sanchez is a 24 y.o. female who presents today for evaluation of possible UTI.  She was seen in clinic most recently by Dr. 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  She was recommended to complete 3 months of suppressive trimethoprim with plans for symptom recheck upon completion.  Suppressive agent was subsequently switched to Macrobid per culture results.  Unfortunately, patient canceled her follow-up appointment.  She states she only took Macrobid for 1 month before discontinuing this.  Now, she reports intermittent terminal dysuria and malodorous urine.  Overall, she is minimally bothered by this and states her symptoms are exacerbated with dietary changes including intake of acidic beverages.  In-office UA today positive for 3+ blood and nitrites; urine microscopy with 6-10 WBCs/HPF, 3-10 RBCs/HPF, and many bacteria.  Notably, patient is currently menstruating.  PMH: Past Medical History:  Diagnosis Date  . Asthma   . BV (bacterial vaginosis)   . History of twin pregnancy in prior pregnancy   . Premature delivery   . UTI (urinary tract infection)     Surgical History: Past Surgical History:  Procedure Laterality Date  . CESAREAN SECTION N/A 01/10/2016   Procedure: CESAREAN SECTION;  Surgeon: Catalina Antigua, MD;  Location: [facility];  Service: Obstetrics;  Laterality: N/A;    Home Medications:  Allergies as of 04/22/2020      Reactions   Shellfish Allergy Swelling      Medication List       Accurate as of April 22, 2020  3:55 PM. If you have any questions, ask your nurse or doctor.        albuterol 108 (90 Base) MCG/ACT inhaler Commonly known as: VENTOLIN HFA Inhale 2 puffs into the lungs every 6 (six)  hours as needed.   amitriptyline 10 MG tablet Commonly known as: ELAVIL Take 1 tablet (10 mg total) by mouth at bedtime.   EPINEPHrine 0.3 mg/0.3 mL Soaj injection Commonly known as: EPI-PEN Inject 0.3 mg into the muscle as needed for anaphylaxis.   escitalopram 10 MG tablet Commonly known as: LEXAPRO Take 1 tablet (10 mg total) by mouth daily.   naproxen 500 MG tablet Commonly known as: NAPROSYN Take 1 tablet (500 mg total) by mouth 2 (two) times daily with a meal.   norgestimate-ethinyl estradiol 0.25-35 MG-MCG tablet Commonly known as: Sprintec 28 Take 1 tablet by mouth daily.   SUMAtriptan 25 MG tablet Commonly known as: Imitrex Take 1 tablet (25 mg total) by mouth every 2 (two) hours as needed for migraine. May repeat in 2 hours if headache persists or recurs.       Allergies:  Allergies  Allergen Reactions  . Shellfish Allergy Swelling    Family History: Family History  Problem Relation Age of Onset  . Hypertension Mother   . Sickle cell trait Mother   . Hypertension Daughter        pulmonary  . Cancer Paternal Uncle   . Gout Maternal Grandfather   . Cancer Paternal Uncle   . Cancer Paternal Uncle   . Bladder Cancer Neg Hx   . Kidney cancer Neg Hx     Social History:   reports that she has quit smoking. Her smoking use included cigarettes. She has never used smokeless tobacco. She  reports that she does not drink alcohol and does not use drugs.  Physical Exam: BP 107/70 (BP Location: Left Arm, Patient Position: Sitting, Cuff Size: Normal)   Pulse 76   Ht 5' (1.524 m)   Wt 125 lb 9.6 oz (57 kg)   BMI 24.53 kg/m   Constitutional:  Alert and oriented, no acute distress, nontoxic appearing HEENT: [place], AT Cardiovascular: No clubbing, cyanosis, or edema Respiratory: Normal respiratory effort, no increased work of breathing Skin: No rashes, bruises or suspicious lesions Neurologic: Grossly intact, no focal deficits, moving all 4 extremities Psychiatric:  Normal mood and affect  Laboratory Data: Results for orders placed or performed in visit on 04/22/20  Microscopic Examination   Urine  Result Value Ref Range   WBC, UA 6-10 (A) 0 - 5 /hpf   RBC 3-10 (A) 0 - 2 /hpf   Epithelial Cells (non renal) 0-10 0 - 10 /hpf   Bacteria, UA Many (A) None seen/Few  Urinalysis, Complete  Result Value Ref Range   Specific Gravity, UA 1.025 1.005 - 1.030   pH, UA 5.0 5.0 - 7.5   Color, UA Yellow Yellow   Appearance Ur Cloudy (A) Clear   Leukocytes,UA Negative Negative   Protein,UA Negative Negative/Trace   Glucose, UA Negative Negative   Ketones, UA Negative Negative   RBC, UA 3+ (A) Negative   Bilirubin, UA Negative Negative   Urobilinogen, Ur 0.2 0.2 - 1.0 mg/dL   Nitrite, UA Positive (A) Negative   Microscopic Examination See below:    Assessment & Plan:   1. Dysuria UA notable for nitrites, pyuria, and bacteriuria today.  Will treat with empiric Augmentin and send for culture for further evaluation.  Upon completion of her treatment course of antibiotics, I would like her to start a 70-month course of suppressive Macrobid with plans for symptom recheck upon completion.  Patient is in agreement with this plan. - Urinalysis, Complete - CULTURE, URINE COMPREHENSIVE - amoxicillin-clavulanate (AUGMENTIN) 875-125 MG tablet; Take 1 tablet by mouth 2 (two) times daily for 7 days.  Dispense: 14 tablet; Refill: 0 - nitrofurantoin, macrocrystal-monohydrate, (MACROBID) 100 MG capsule; Take 1 capsule (100 mg total) by mouth daily.  Dispense: 30 capsule; Refill: 2  2. Microscopic hematuria Ongoing, however she is menstruating today and this may represent sample contamination.  Return in about 3 months (around 07/21/2020) for Symptom recheck and UA.  Carman Ching, PA-C  [facility] [address], [address] [place], [place] [address] [phone]

## 2020-04-30 ENCOUNTER — Other Ambulatory Visit: Payer: Self-pay | Admitting: Nurse Practitioner

## 2020-05-01 LAB — CULTURE, URINE COMPREHENSIVE

## 2020-05-06 ENCOUNTER — Ambulatory Visit: Payer: Self-pay | Admitting: Nurse Practitioner

## 2020-07-02 ENCOUNTER — Ambulatory Visit: Payer: Self-pay | Admitting: *Deleted

## 2020-07-02 NOTE — Telephone Encounter (Signed)
Called pt scheduled with Dr Laural Benes tomorrow morning. Pt states that she had just stepped outside when this happened and since she went back into the building at work she has gotten better. Pt states that she didn't plan to go to [facility] because she's self pay.

## 2020-07-02 NOTE — Telephone Encounter (Signed)
Patient states she is having swelling in her eyelid and below- itchy, warm, both eyes. Patient reports Benadryl helped- but when it wore off- the swelling returned. Patient is not reporting rash anywhere else and no problems with her breathing- no open appointment in office- advised UC for evaluation.  Reason for Disposition . [1] SEVERE eyelid swelling (i.e., shut or almost) AND [2] involves both eyes (Exception: itchy eyes, which  are probably an allergic reaction)  Answer Assessment - Initial Assessment Questions 1. ONSET: "When did the swelling start?" (e.g., minutes, hours, days)     late afternoon-last night 2. LOCATION: "What part of the eyelids is swollen?"     Both eyes- swelling eyelid and under 3. SEVERITY: "How swollen is it?"     Felling warm and itchy 4. ITCHING: "Is there any itching?" If Yes, ask: "How much?"   (Scale 1-10; mild, moderate or severe)     Yes- 3 5. PAIN: "Is the swelling painful to touch?" If Yes, ask: "How painful is it?"   (Scale 1-10; mild, moderate or severe)     No pain 6. FEVER: "Do you have a fever?" If Yes, ask: "What is it, how was it measured, and when did it start?"      No fever 7. CAUSE: "What do you think is causing the swelling?"     unsure 8. RECURRENT SYMPTOM: "Have you had eyelid swelling before?" If Yes, ask: "When was the last time?" "What happened that time?"     no 9. OTHER SYMPTOMS: "Do you have any other symptoms?" (e.g., blurred vision, eye discharge, rash, runny nose)     Redness where swelling 10. PREGNANCY: "Is there any chance you are pregnant?" "When was your last menstrual period?"       No- LMP- 2/22  Protocols used: E[facility] - [facility]

## 2020-07-03 ENCOUNTER — Encounter: Payer: Self-pay | Admitting: Nurse Practitioner

## 2020-07-03 ENCOUNTER — Other Ambulatory Visit: Payer: Self-pay

## 2020-07-03 ENCOUNTER — Ambulatory Visit (INDEPENDENT_AMBULATORY_CARE_PROVIDER_SITE_OTHER): Payer: Self-pay | Admitting: Nurse Practitioner

## 2020-07-03 VITALS — BP 95/62 | HR 88 | Temp 99.0°F | Wt 125.4 lb

## 2020-07-03 DIAGNOSIS — T7840XA Allergy, unspecified, initial encounter: Secondary | ICD-10-CM

## 2020-07-03 DIAGNOSIS — N898 Other specified noninflammatory disorders of vagina: Secondary | ICD-10-CM

## 2020-07-03 DIAGNOSIS — N760 Acute vaginitis: Secondary | ICD-10-CM

## 2020-07-03 DIAGNOSIS — B9689 Other specified bacterial agents as the cause of diseases classified elsewhere: Secondary | ICD-10-CM

## 2020-07-03 LAB — MICROSCOPIC EXAMINATION: RBC, Urine: NONE SEEN /hpf (ref 0–2)

## 2020-07-03 LAB — WET PREP FOR TRICH, YEAST, CLUE
Clue Cell Exam: POSITIVE — AB
Trichomonas Exam: NEGATIVE
Yeast Exam: NEGATIVE

## 2020-07-03 LAB — URINALYSIS, ROUTINE W REFLEX MICROSCOPIC
Bilirubin, UA: NEGATIVE
Glucose, UA: NEGATIVE
Ketones, UA: NEGATIVE
Nitrite, UA: NEGATIVE
Protein,UA: NEGATIVE
RBC, UA: NEGATIVE
Specific Gravity, UA: 1.02 (ref 1.005–1.030)
Urobilinogen, Ur: 0.2 mg/dL (ref 0.2–1.0)
pH, UA: 6 (ref 5.0–7.5)

## 2020-07-03 MED ORDER — METRONIDAZOLE 0.75 % VA GEL: 1.0000 | Freq: Every day | VAGINAL | 0 refills | Status: AC

## 2020-07-03 NOTE — Progress Notes (Signed)
Acute Office Visit  Subjective:    Patient ID: Allison Sanchez, female    DOB: August 12, 1995, 25 y.o.   MRN: [id number]  Chief Complaint  Patient presents with  . Itchy Eye    Pt states she has been having itchy and swollen eyes for the last few days. States it comes and goes. Effects one eye and will then effect the other. Redness and itching only lasts for a short time and then it goes away. Patient has pictures on her phone to show.  . vaginal odor    Pt states she has been having vaginal odor for the last couple of weeks. States she has not really noticed any other symptoms except the odor. Requesting to have cream instead of tablets if positive for BV.     HPI Patient is in today for red, swollen eyes and vaginal odor  ALLERGIES  Started Wednesday afternoon after getting off work and layed down in bed. Happened again after working outside. Appears to be intermittent. Has not eaten any new food, or used new soaps, or laundry detergent. She did get a new body lotion/spray from [employer].  Duration: days Runny nose: no  Nasal congestion: no Nasal itching: no Sneezing: no Eye swelling, itching or discharge: yes Post nasal drip: no Cough: no Sinus pressure: no  Ear pain: no  Ear pressure: no  Fever: no  Symptoms occur seasonally: yes, usually in the spring sneezes frequently, eye itching, but no swelling Symptoms occur perenially: no Allergist evaluation in past: no Allergen injection immunotherapy: no Recurrent sinus infections: no ENT evaluation in past: no Indoor pets: no History of asthma: yes Current allergy medications: none Treatments attempted: benadryl   VAGINAL DISCHARGE  Duration: weeks, Started 2 weeks ago Discharge description: clear  Pruritus: no Dysuria: no Malodorous: yes Urinary frequency: no Fevers: no Abdominal pain: no  Sexual activity: monogamous History of sexually transmitted diseases: no Recent antibiotic use: no Context:  worsening, has history of BV  Treatments attempted: none  Past Medical History:  Diagnosis Date  . Asthma   . BV (bacterial vaginosis)   . History of twin pregnancy in prior pregnancy   . Premature delivery   . UTI (urinary tract infection)     Past Surgical History:  Procedure Laterality Date  . CESAREAN SECTION N/A 01/10/2016   Procedure: CESAREAN SECTION;  Surgeon: Catalina Antigua, MD;  Location: [facility];  Service: Obstetrics;  Laterality: N/A;    Family History  Problem Relation Age of Onset  . Hypertension Mother   . Sickle cell trait Mother   . Hypertension Daughter        pulmonary  . Cancer Paternal Uncle   . Gout Maternal Grandfather   . Cancer Paternal Uncle   . Cancer Paternal Uncle   . Bladder Cancer Neg Hx   . Kidney cancer Neg Hx     Social History   Socioeconomic History  . Marital status: Single    Spouse name: Not on file  . Number of children: Not on file  . Years of education: Not on file  . Highest education level: Not on file  Occupational History  . Not on file  Tobacco Use  . Smoking status: Former Smoker    Types: Cigarettes  . Smokeless tobacco: Never Used  Vaping Use  . Vaping Use: Never used  Substance and Sexual Activity  . Alcohol use: No  . Drug use: No  . Sexual activity: Yes  Birth control/protection: None  Other Topics Concern  . Not on file  Social History Narrative  . Not on file   Social Determinants of Health   Financial Resource Strain: Not on file  Food Insecurity: Not on file  Transportation Needs: Not on file  Physical Activity: Not on file  Stress: Not on file  Social Connections: Not on file  Intimate Partner Violence: Not on file    Outpatient Medications Prior to Visit  Medication Sig Dispense Refill  . albuterol (PROVENTIL HFA;VENTOLIN HFA) 108 (90 Base) MCG/ACT inhaler Inhale 2 puffs into the lungs every 6 (six) hours as needed. 1 Inhaler 2  . EPINEPHrine 0.3 mg/0.3 mL IJ SOAJ injection  Inject 0.3 mg into the muscle as needed for anaphylaxis.     Marland Kitchen amitriptyline (ELAVIL) 10 MG tablet Take 1 tablet (10 mg total) by mouth at bedtime. (Patient not taking: No sig reported) 90 tablet 4  . escitalopram (LEXAPRO) 10 MG tablet TAKE 1 TABLET BY MOUTH EVERY DAY (Patient not taking: Reported on 07/03/2020) 90 tablet 0  . naproxen (NAPROSYN) 500 MG tablet Take 1 tablet (500 mg total) by mouth 2 (two) times daily with a meal. (Patient not taking: No sig reported) 60 tablet 1  . norgestimate-ethinyl estradiol (SPRINTEC 28) 0.25-35 MG-MCG tablet Take 1 tablet by mouth daily. (Patient not taking: No sig reported) 3 Package 3  . SUMAtriptan (IMITREX) 25 MG tablet Take 1 tablet (25 mg total) by mouth every 2 (two) hours as needed for migraine. May repeat in 2 hours if headache persists or recurs. (Patient not taking: No sig reported) 10 tablet 0  . nitrofurantoin, macrocrystal-monohydrate, (MACROBID) 100 MG capsule Take 1 capsule (100 mg total) by mouth daily. 30 capsule 2   No facility-administered medications prior to visit.    Allergies  Allergen Reactions  . Shellfish Allergy Swelling    Review of Systems  Constitutional: Negative for fatigue and fever.  HENT: Negative.   Eyes: Positive for redness and itching.  Respiratory: Negative.   Cardiovascular: Negative.   Gastrointestinal: Negative.   Endocrine: Negative.   Genitourinary: Negative.   Musculoskeletal: Negative.   Neurological: Negative.   Psychiatric/Behavioral: Negative.        Objective:    Physical Exam Vitals and nursing note reviewed.  Constitutional:      General: She is not in acute distress.    Appearance: Normal appearance.  HENT:     Head: Normocephalic.     Right Ear: Tympanic membrane, ear canal and external ear normal.     Left Ear: Tympanic membrane, ear canal and external ear normal.     Nose: Nose normal.     Mouth/Throat:     Mouth: Mucous membranes are moist.     Pharynx: Posterior  oropharyngeal erythema (slight erythema areas on pharynx) present.  Eyes:     General:        Right eye: No discharge.        Left eye: No discharge.     Conjunctiva/sclera: Conjunctivae normal.  Cardiovascular:     Rate and Rhythm: Normal rate and regular rhythm.     Pulses: Normal pulses.     Heart sounds: Normal heart sounds.  Pulmonary:     Effort: Pulmonary effort is normal.     Breath sounds: Normal breath sounds.  Musculoskeletal:     Cervical back: Normal range of motion.  Skin:    General: Skin is warm.  Neurological:     General: No focal  deficit present.     Mental Status: She is alert and oriented to person, place, and time.  Psychiatric:        Mood and Affect: Mood normal.        Behavior: Behavior normal.        Thought Content: Thought content normal.        Judgment: Judgment normal.   Pictures viewed on her phone from when eyes were swelling. Noted to have redness, edema, and wheals. Stated this resolved within a few minutes.   BP 95/62   Pulse 88   Temp 99 F (37.2 C) (Oral)   Wt 125 lb 6.4 oz (56.9 kg)   BMI 24.49 kg/m  Wt Readings from Last 3 Encounters:  07/03/20 125 lb 6.4 oz (56.9 kg)  04/22/20 125 lb 9.6 oz (57 kg)  04/06/20 127 lb 6.4 oz (57.8 kg)     Lab Results  Component Value Date   WBC 19.7 (H) 08/12/2019   HGB 14.5 08/12/2019   HCT 41.6 08/12/2019   MCV 88.7 08/12/2019   PLT 233 08/12/2019   Lab Results  Component Value Date   NA 137 08/12/2019   K 4.1 08/12/2019   CO2 16 (L) 08/12/2019   GLUCOSE 95 08/12/2019   BUN 9 08/12/2019   CREATININE 0.65 08/12/2019   BILITOT 1.4 (H) 08/12/2019   ALKPHOS 73 08/12/2019   AST 15 08/12/2019   ALT 11 08/12/2019   PROT 9.6 (H) 08/12/2019   ALBUMIN 4.5 08/12/2019   CALCIUM 9.9 08/12/2019   ANIONGAP 17 (H) 08/12/2019       Assessment & Plan:   Problem List Items Addressed This Visit   None   Visit Diagnoses    Allergy, initial encounter    -  Primary   Start claritin or  zyrtec daily. Use benadryl OTC as needed. Can also start pepcid daily if symptoms keep occuring. F/U if symptoms worsen or don't improve.   Vaginal odor       Checked wet prep and u/a. Positive for BV. See plan for BV   Relevant Orders   Urinalysis, Routine w reflex microscopic   WET PREP FOR TRICH, YEAST, CLUE   BV (bacterial vaginosis)       Has a history of BV infections. Wet prep positive for clue cells. Prefers vaginal cream for treatment, sent to pharmacy. Follow-up if symptoms do not improve.       Meds ordered this encounter  Medications  . metroNIDAZOLE (METROGEL) 0.75 % vaginal gel    Sig: Place 1 Applicatorful vaginally at bedtime for 5 days.    Dispense:  70 g    Refill:  0     Gerre Scull, NP

## 2020-07-03 NOTE — Patient Instructions (Signed)
It was great to see you!  Start taking zyrtec or claritin daily in the morning. Then you can take benadryl as needed during the day or at night if symptoms worsen or come back. You can also take pepcid once a day if symptoms are not improving or happen frequently. I am also sending antibiotic to your pharmacy for your BV.   Take care,  Rodman Pickle, NP

## 2020-07-20 ENCOUNTER — Ambulatory Visit: Payer: Self-pay | Admitting: Physician Assistant

## 2020-07-31 ENCOUNTER — Encounter: Payer: Self-pay | Admitting: Physician Assistant

## 2020-07-31 ENCOUNTER — Other Ambulatory Visit: Payer: Self-pay

## 2020-07-31 ENCOUNTER — Ambulatory Visit (INDEPENDENT_AMBULATORY_CARE_PROVIDER_SITE_OTHER): Payer: Self-pay | Admitting: Physician Assistant

## 2020-07-31 VITALS — BP 112/68 | HR 89 | Ht 60.0 in | Wt 124.0 lb

## 2020-07-31 DIAGNOSIS — R3129 Other microscopic hematuria: Secondary | ICD-10-CM

## 2020-07-31 DIAGNOSIS — R300 Dysuria: Secondary | ICD-10-CM

## 2020-07-31 DIAGNOSIS — N9412 Deep dyspareunia: Secondary | ICD-10-CM

## 2020-07-31 LAB — URINALYSIS, COMPLETE
Bilirubin, UA: NEGATIVE
Glucose, UA: NEGATIVE
Ketones, UA: NEGATIVE
Leukocytes,UA: NEGATIVE
Nitrite, UA: NEGATIVE
Protein,UA: NEGATIVE
Specific Gravity, UA: 1.025 (ref 1.005–1.030)
Urobilinogen, Ur: 0.2 mg/dL (ref 0.2–1.0)
pH, UA: 5.5 (ref 5.0–7.5)

## 2020-07-31 LAB — MICROSCOPIC EXAMINATION

## 2020-07-31 NOTE — Progress Notes (Signed)
07/31/2020 2:19 PM   Allison Sanchez 03-12-1996 [id number]  CC: Chief Complaint  Patient presents with  . Follow-up    HPI: Allison Sanchez is a 25 y.o. female with PMH dysuria with cystoscopic findings of diffuse cystitis cystica, hematuria, and recurrent BV who presents today for follow-up.  I saw her in clinic most recently on 04/22/2020 for acute cystitis and counseled her to proceed with Dr. Delana Meyer previous recommendation to complete a 75-month course of suppressive Macrobid.  Today she reports having completed culture appropriate Augmentin for her recent UTI managed by me.  She subsequently started suppressive Macrobid as instructed, but only took approximately 2 weeks of this.  She states she struggles to remember to take daily medications.  She reports slight terminal dysuria and tingling after completion without urgency or frequency.  She continues to note exacerbation of her dysuria with intake of acidic beverages and alcohol.  She also notes 2 episodes of dyspareunia last week that were characterized as a deep pelvic soreness not localized to the vagina.  She thinks she may have been ovulating around that time.  In-office UA today positive for trace intact blood; urine microscopy pan negative.  PMH: Past Medical History:  Diagnosis Date  . Asthma   . BV (bacterial vaginosis)   . History of twin pregnancy in prior pregnancy   . Premature delivery   . UTI (urinary tract infection)     Surgical History: Past Surgical History:  Procedure Laterality Date  . CESAREAN SECTION N/A 01/10/2016   Procedure: CESAREAN SECTION;  Surgeon: Catalina Antigua, MD;  Location: [facility];  Service: Obstetrics;  Laterality: N/A;    Home Medications:  Allergies as of 07/31/2020      Reactions   Shellfish Allergy Swelling      Medication List       Accurate as of July 31, 2020  2:19 PM. If you have any questions, ask your nurse or doctor.        albuterol 108 (90 Base)  MCG/ACT inhaler Commonly known as: VENTOLIN HFA Inhale 2 puffs into the lungs every 6 (six) hours as needed.   amitriptyline 10 MG tablet Commonly known as: ELAVIL Take 1 tablet (10 mg total) by mouth at bedtime.   EPINEPHrine 0.3 mg/0.3 mL Soaj injection Commonly known as: EPI-PEN Inject 0.3 mg into the muscle as needed for anaphylaxis.   escitalopram 10 MG tablet Commonly known as: LEXAPRO TAKE 1 TABLET BY MOUTH EVERY DAY   naproxen 500 MG tablet Commonly known as: NAPROSYN Take 1 tablet (500 mg total) by mouth 2 (two) times daily with a meal.   norgestimate-ethinyl estradiol 0.25-35 MG-MCG tablet Commonly known as: Sprintec 28 Take 1 tablet by mouth daily.   SUMAtriptan 25 MG tablet Commonly known as: Imitrex Take 1 tablet (25 mg total) by mouth every 2 (two) hours as needed for migraine. May repeat in 2 hours if headache persists or recurs.       Allergies:  Allergies  Allergen Reactions  . Shellfish Allergy Swelling    Family History: Family History  Problem Relation Age of Onset  . Hypertension Mother   . Sickle cell trait Mother   . Hypertension Daughter        pulmonary  . Cancer Paternal Uncle   . Gout Maternal Grandfather   . Cancer Paternal Uncle   . Cancer Paternal Uncle   . Bladder Cancer Neg Hx   . Kidney cancer Neg Hx  Social History:   reports that she has quit smoking. Her smoking use included cigarettes. She has never used smokeless tobacco. She reports that she does not drink alcohol and does not use drugs.  Physical Exam: BP 112/68   Pulse 89   Ht 5' (1.524 m)   Wt 124 lb (56.2 kg)   BMI 24.22 kg/m   Constitutional:  Alert and oriented, no acute distress, nontoxic appearing HEENT: [place], AT Cardiovascular: No clubbing, cyanosis, or edema Respiratory: Normal respiratory effort, no increased work of breathing Skin: No rashes, bruises or suspicious lesions Neurologic: Grossly intact, no focal deficits, moving all 4  extremities Psychiatric: Normal mood and affect  Laboratory Data: Results for orders placed or performed in visit on 07/31/20  Microscopic Examination   Urine  Result Value Ref Range   WBC, UA 0-5 0 - 5 /hpf   RBC 0-2 0 - 2 /hpf   Epithelial Cells (non renal) 0-10 0 - 10 /hpf   Renal Epithel, UA 0-10 (A) None seen /hpf   Bacteria, UA Present None seen/Few  Urinalysis, Complete  Result Value Ref Range   Specific Gravity, UA 1.025 1.005 - 1.030   pH, UA 5.5 5.0 - 7.5   Color, UA Yellow Yellow   Appearance Ur Hazy (A) Clear   Leukocytes,UA Negative Negative   Protein,UA Negative Negative/Trace   Glucose, UA Negative Negative   Ketones, UA Negative Negative   RBC, UA Trace (A) Negative   Bilirubin, UA Negative Negative   Urobilinogen, Ur 0.2 0.2 - 1.0 mg/dL   Nitrite, UA Negative Negative   Microscopic Examination See below:    Assessment & Plan:   1. Dysuria UA today benign.  Patient wishes to reattempt a 58-month course of suppressive Macrobid and I am in agreement with this plan.  Patient to follow-up via MyChart with any residual symptoms upon completion. - Urinalysis, Complete  2. Microscopic hematuria Resolved on UA today.  No further intervention indicated. - Urinalysis, Complete  3. Deep dyspareunia in female Consider pelvic PT if this becomes more consistent.  Return if symptoms worsen or fail to improve.  Carman Ching, PA-C  [facility] [address], [address] [place], [place] [address] [phone]

## 2020-09-04 ENCOUNTER — Telehealth: Payer: Self-pay | Admitting: Nurse Practitioner

## 2020-09-07 ENCOUNTER — Ambulatory Visit (INDEPENDENT_AMBULATORY_CARE_PROVIDER_SITE_OTHER): Payer: Self-pay | Admitting: Nurse Practitioner

## 2020-09-07 ENCOUNTER — Other Ambulatory Visit: Payer: Self-pay

## 2020-09-07 ENCOUNTER — Encounter: Payer: Self-pay | Admitting: Nurse Practitioner

## 2020-09-07 VITALS — BP 97/64 | HR 69 | Temp 98.7°F | Ht 60.5 in | Wt 130.6 lb

## 2020-09-07 DIAGNOSIS — Z1159 Encounter for screening for other viral diseases: Secondary | ICD-10-CM

## 2020-09-07 DIAGNOSIS — G43109 Migraine with aura, not intractable, without status migrainosus: Secondary | ICD-10-CM

## 2020-09-07 DIAGNOSIS — F419 Anxiety disorder, unspecified: Secondary | ICD-10-CM

## 2020-09-07 DIAGNOSIS — R399 Unspecified symptoms and signs involving the genitourinary system: Secondary | ICD-10-CM

## 2020-09-07 DIAGNOSIS — Z0000 Encounter for general adult medical examination without abnormal findings: Secondary | ICD-10-CM

## 2020-09-07 DIAGNOSIS — R8281 Pyuria: Secondary | ICD-10-CM

## 2020-09-07 DIAGNOSIS — Z118 Encounter for screening for other infectious and parasitic diseases: Secondary | ICD-10-CM

## 2020-09-07 DIAGNOSIS — R002 Palpitations: Secondary | ICD-10-CM

## 2020-09-07 DIAGNOSIS — R9431 Abnormal electrocardiogram [ECG] [EKG]: Secondary | ICD-10-CM

## 2020-09-07 NOTE — Assessment & Plan Note (Signed)
Ongoing driven by palpitations, which make her anxious.  Reports at baseline does not have anxiety issues.  Denies SI/HI.  GAD7 - 7 and PHQ9 - 7 -- improvement in GAD since last check.  No current anxiety medications, does not wish to restart Lexapro, as feels anxiety is more related to when she has palpitations.  If worsening symptoms she is to return to office immediately and will reassess.  Plan on labs check TSH, CBC, CMP.  Return in 6 weeks.

## 2020-09-07 NOTE — Assessment & Plan Note (Signed)
Ongoing with recent worsening. EKG in office overall reassuring with exception of ongoing shortened PR noted since 2020 -- discussed with patient.  She agrees with cardiology visit at this time due to ongoing symptoms for years on and off, referral placed.  Plan on return in 6 weeks for follow-up, sooner if worsening symptoms.

## 2020-09-07 NOTE — Assessment & Plan Note (Signed)
Chronic, stable and improved at this time.  Can continue use of Tylenol or Naproxen as needed.  Last CT imaging in December 2020, will defer repeat at this time unless worsening.  No red flag symptoms on HPI or exam.  Return in one year for physical.

## 2020-09-07 NOTE — Progress Notes (Signed)
BP 97/64   Pulse 69   Temp 98.7 F (37.1 C) (Oral)   Ht 5' 0.5" (1.537 m)   Wt 130 lb 9.6 oz (59.2 kg)   SpO2 99%   BMI 25.09 kg/m    Subjective:    Patient ID: Allison Sanchez, female    DOB: [birth year], 25 y.o.   MRN: [id number]  HPI: Allison Sanchez is a 25 y.o. female presenting on 09/07/2020 for comprehensive medical examination. Current medical complaints include:none  She currently lives with: spouse and children Menopausal Symptoms: no   Would like urine check and wet prep today -- history of frequent UTI and infections.  ANXIETY/STRESS Reports this is feeling worse due to palpitations, was on Lexapro (which she never started per her report) and Elavil (also for migraines -- no further headaches).  Palpitations drive the anxiety.  Has palpitations with her anxiety and feels these are worsening, started back again last week.  Denies CP, SOB, N&V, diaphoresis with palpitations.  She does get SOB with anxiety and feels her face pulsate -- but the anxiety only presents when she feels the palpitations which make her anxious.    Has had migraines present in past, last was over one year ago -- no further episodes.  Only takes Naproxen and has not taken Imitrex.   Duration:exacerbated by increased palpitation episodes. Anxious mood: yes  Excessive worrying: yes Irritability: no  Sweating: no Nausea: no Palpitations:yes Hyperventilation: no Panic attacks: no Agoraphobia: no  Obscessions/compulsions: no Depressed mood: no [facility] screen [facility] 04/06/2020 09/04/2019 04/23/2019 05/04/2017  Decreased Interest 1 0 1 0  Down, Depressed, Hopeless 1 0 1 0  PHQ - 2 Score 2 0 2 0  Altered sleeping 1 - 0 -  Tired, decreased energy 1 - 1 -  Change in appetite 1 - 0 -  Feeling bad or failure about yourself  1 - 1 -  Trouble concentrating 1 - 1 -  Moving slowly or fidgety/restless 0 - 0 -  Suicidal thoughts 0 - 0 -  PHQ-9 Score 7 - 5 -  Difficult doing work/chores Somewhat  difficult - - -   Anhedonia: no Weight changes: no Insomnia: none Hypersomnia: no Fatigue/loss of energy: no Feelings of worthlessness: no Feelings of guilt: no Impaired concentration/indecisiveness: no Suicidal ideations: no  Crying spells: no Recent Stressors/Life Changes: no   Relationship problems: no   Family stress: no     Financial stress: no    Job stress: no    Recent death/loss: no GAD 7 : Generalized Anxiety Score 09/07/2020 04/06/2020  Nervous, Anxious, on Edge 1 2  Control/stop worrying 1 2  Worry too much - different things 1 2  Trouble relaxing 1 2  Restless 0 2  Easily annoyed or irritable 1 2  Afraid - awful might happen 2 3  Total GAD 7 Score 7 15  Anxiety Difficulty Not difficult at all Very difficult   PALPITATIONS Has palpitations with her anxiety and feels these are worsening, started back again last week.  Denies CP, SOB, N&V, diaphoresis with palpitations.  She does get SOB with anxiety and feels her face pulsate.  Reports it does not take much for her to feel exhausted.  Has never gone for Holter monitor -- initially episodes started in high school.  No family history of cardiac disease. Duration: chronic Symptom description: flutter and skip Duration of episode: varies -- seconds Frequency: recurrentl  -- < 10 times a day presents Activity when event  occurred: working or sitting Related to exertion: no Dyspnea: no Chest pain: no Syncope: no Anxiety/stress: yes Nausea/vomiting: no Diaphoresis: no Coronary artery disease: no Congestive heart failure: no Arrhythmia:no Thyroid disease: no Caffeine intake: none Status:  fluctuating Treatments attempted:none   The patient does not have a history of falls. I did not complete a risk assessment for falls. A plan of care for falls was not documented.   Past Medical History:  Past Medical History:  Diagnosis Date  . Anxiety   . Asthma   . BV (bacterial vaginosis)   . History of twin pregnancy  in prior pregnancy   . Premature delivery   . UTI (urinary tract infection)     Surgical History:  Past Surgical History:  Procedure Laterality Date  . CESAREAN SECTION N/A 01/10/2016   Procedure: CESAREAN SECTION;  Surgeon: Catalina Antigua, MD;  Location: [facility];  Service: Obstetrics;  Laterality: N/A;    Medications:  Current Outpatient Medications on File Prior to Visit  Medication Sig  . albuterol (PROVENTIL HFA;VENTOLIN HFA) 108 (90 Base) MCG/ACT inhaler Inhale 2 puffs into the lungs every 6 (six) hours as needed.  Marland Kitchen EPINEPHrine 0.3 mg/0.3 mL IJ SOAJ injection Inject 0.3 mg into the muscle as needed for anaphylaxis.    No current facility-administered medications on file prior to visit.    Allergies:  Allergies  Allergen Reactions  . Shellfish Allergy Swelling    Social History:  Social History   Socioeconomic History  . Marital status: Single    Spouse name: Not on file  . Number of children: Not on file  . Years of education: Not on file  . Highest education level: Not on file  Occupational History  . Not on file  Tobacco Use  . Smoking status: Former Smoker    Types: Cigarettes  . Smokeless tobacco: Never Used  Vaping Use  . Vaping Use: Never used  Substance and Sexual Activity  . Alcohol use: No  . Drug use: No  . Sexual activity: Yes    Birth control/protection: None  Other Topics Concern  . Not on file  Social History Narrative  . Not on file   Social Determinants of Health   Financial Resource Strain: Not on file  Food Insecurity: Not on file  Transportation Needs: Not on file  Physical Activity: Not on file  Stress: Not on file  Social Connections: Not on file  Intimate Partner Violence: Not on file   Social History   Tobacco Use  Smoking Status Former Smoker  . Types: Cigarettes  Smokeless Tobacco Never Used   Social History   Substance and Sexual Activity  Alcohol Use No    Family History:  Family History  Problem  Relation Age of Onset  . Hypertension Mother   . Sickle cell trait Mother   . Hypertension Daughter        pulmonary  . Cancer Paternal Uncle   . Gout Maternal Grandfather   . Cancer Paternal Uncle   . Cancer Paternal Uncle   . Bladder Cancer Neg Hx   . Kidney cancer Neg Hx     Past medical history, surgical history, medications, allergies, family history and social history reviewed with patient today and changes made to appropriate areas of the chart.   Review of Systems - negative All other ROS negative except what is listed above and in the HPI.      Objective:    BP 97/64   Pulse 69  Temp 98.7 F (37.1 C) (Oral)   Ht 5' 0.5" (1.537 m)   Wt 130 lb 9.6 oz (59.2 kg)   SpO2 99%   BMI 25.09 kg/m   Wt Readings from Last 3 Encounters:  09/07/20 130 lb 9.6 oz (59.2 kg)  07/31/20 124 lb (56.2 kg)  07/03/20 125 lb 6.4 oz (56.9 kg)    Physical Exam Vitals and nursing note reviewed. Exam conducted with a chaperone present.  Constitutional:      General: She is awake. She is not in acute distress.    Appearance: She is well-developed. She is not ill-appearing.  HENT:     Head: Normocephalic and atraumatic.     Right Ear: Hearing, tympanic membrane, ear canal and external ear normal. No drainage.     Left Ear: Hearing, tympanic membrane, ear canal and external ear normal. No drainage.     Nose: Nose normal.     Right Sinus: No maxillary sinus tenderness or frontal sinus tenderness.     Left Sinus: No maxillary sinus tenderness or frontal sinus tenderness.     Mouth/Throat:     Mouth: Mucous membranes are moist.     Pharynx: Oropharynx is clear. Uvula midline. No pharyngeal swelling, oropharyngeal exudate or posterior oropharyngeal erythema.  Eyes:     General: Lids are normal.        Right eye: No discharge.        Left eye: No discharge.     Extraocular Movements: Extraocular movements intact.     Conjunctiva/sclera: Conjunctivae normal.     Pupils: Pupils are equal,  round, and reactive to light.     Visual Fields: Right eye visual fields normal and left eye visual fields normal.  Neck:     Thyroid: No thyromegaly.     Vascular: No carotid bruit.     Trachea: Trachea normal.  Cardiovascular:     Rate and Rhythm: Normal rate and regular rhythm.     Heart sounds: Normal heart sounds. No murmur heard. No gallop.   Pulmonary:     Effort: Pulmonary effort is normal. No accessory muscle usage or respiratory distress.     Breath sounds: Normal breath sounds.  Chest:  Breasts:     Right: Normal. No axillary adenopathy or supraclavicular adenopathy.     Left: Normal. No axillary adenopathy or supraclavicular adenopathy.    Abdominal:     General: Bowel sounds are normal.     Palpations: Abdomen is soft. There is no hepatomegaly or splenomegaly.     Tenderness: There is no abdominal tenderness.  Genitourinary:    Vagina: Normal.     Cervix: Normal.     Uterus: Normal.      Adnexa: Right adnexa normal and left adnexa normal.     Comments:   Musculoskeletal:        General: Normal range of motion.     Cervical back: Normal range of motion and neck supple.     Right lower leg: No edema.     Left lower leg: No edema.  Lymphadenopathy:     Head:     Right side of head: No submental, submandibular, tonsillar, preauricular or posterior auricular adenopathy.     Left side of head: No submental, submandibular, tonsillar, preauricular or posterior auricular adenopathy.     Cervical: No cervical adenopathy.     Upper Body:     Right upper body: No supraclavicular, axillary or pectoral adenopathy.     Left upper body: No  supraclavicular, axillary or pectoral adenopathy.  Skin:    General: Skin is warm and dry.     Capillary Refill: Capillary refill takes less than 2 seconds.     Findings: No rash.  Neurological:     Mental Status: She is alert and oriented to person, place, and time.     Cranial Nerves: Cranial nerves are intact.     Gait: Gait is  intact.     Deep Tendon Reflexes: Reflexes are normal and symmetric.     Reflex Scores:      Brachioradialis reflexes are 2+ on the right side and 2+ on the left side.      Patellar reflexes are 2+ on the right side and 2+ on the left side. Psychiatric:        Attention and Perception: Attention normal.        Mood and Affect: Mood normal.        Speech: Speech normal.        Behavior: Behavior normal. Behavior is cooperative.        Thought Content: Thought content normal.        Judgment: Judgment normal.    Results for orders placed or performed in visit on 07/31/20  Microscopic Examination   Urine  Result Value Ref Range   WBC, UA 0-5 0 - 5 /hpf   RBC 0-2 0 - 2 /hpf   Epithelial Cells (non renal) 0-10 0 - 10 /hpf   Renal Epithel, UA 0-10 (A) None seen /hpf   Bacteria, UA Present None seen/Few  Urinalysis, Complete  Result Value Ref Range   Specific Gravity, UA 1.025 1.005 - 1.030   pH, UA 5.5 5.0 - 7.5   Color, UA Yellow Yellow   Appearance Ur Hazy (A) Clear   Leukocytes,UA Negative Negative   Protein,UA Negative Negative/Trace   Glucose, UA Negative Negative   Ketones, UA Negative Negative   RBC, UA Trace (A) Negative   Bilirubin, UA Negative Negative   Urobilinogen, Ur 0.2 0.2 - 1.0 mg/dL   Nitrite, UA Negative Negative   Microscopic Examination See below:       Assessment & Plan:   Problem List Items Addressed This Visit      Cardiovascular and Mediastinum   Migraine    Chronic, stable and improved at this time.  Can continue use of Tylenol or Naproxen as needed.  Last CT imaging in December 2020, will defer repeat at this time unless worsening.  No red flag symptoms on HPI or exam.  Return in one year for physical.        Other   Urinary symptom or sign    Acute -- UA 2+ BLD (?menstrual cycle),  Trace LEUK, and neg NIT -- will send for culture and determine need for treatment upon return and abx to treat with.  Wet prep + for clue cells, neg trich and  yeast.  Last BV infection 04/06/20 -- she will use Flagyl gel which she has at home.  Discussed at length with patient and she is in agreeance with plan.  She is aware PCP will reach out via MyChart with urine culture and any further steps needed.  Recommend increased hydration at home and ensuring partner does good hand washing prior to tactile stimulation with intercourse.  Return to office for worsening or ongoing symptoms.      Relevant Orders   Urinalysis, Routine w reflex microscopic   WET PREP FOR TRICH, YEAST, CLUE   Palpitations -  Primary    Ongoing with recent worsening. EKG in office overall reassuring with exception of ongoing shortened PR noted since 2020 -- discussed with patient.  She agrees with cardiology visit at this time due to ongoing symptoms for years on and off, referral placed.  Plan on return in 6 weeks for follow-up, sooner if worsening symptoms.      Relevant Orders   EKG 12-Lead (Completed)   Comprehensive metabolic panel   CBC with Differential/Platelet   Lipid Panel w/o Chol/HDL Ratio   TSH   Anxiety    Ongoing driven by palpitations, which make her anxious.  Reports at baseline does not have anxiety issues.  Denies SI/HI.  GAD7 - 7 and PHQ9 - 7 -- improvement in GAD since last check.  No current anxiety medications, does not wish to restart Lexapro, as feels anxiety is more related to when she has palpitations.  If worsening symptoms she is to return to office immediately and will reassess.  Plan on labs check TSH, CBC, CMP.  Return in 6 weeks.       Other Visit Diagnoses    Abnormal finding on EKG       Referral to cardiology placed.   Relevant Orders   Ambulatory referral to Cardiology   Pyuria       Relevant Orders   Urine Culture   Need for hepatitis C screening test       Hep C screening on blood work today for one time screening, discussed with patient   Relevant Orders   Hepatitis C antibody   Screening for chlamydial disease       Urine screen  today.   Relevant Orders   GC/Chlamydia Probe Amp   Annual physical exam       Annual labs today and health maintenance reviewed.       Follow up plan: Return in about 6 weeks (around 10/19/2020) for Palpitations.   LABORATORY TESTING:  - Pap smear: Up To Date  IMMUNIZATIONS:   - Tdap: Tetanus vaccination status reviewed: Td vaccination indicated and given today. - Influenza: Up to date - Pneumovax: Not applicable - Prevnar: Not applicable - HPV: Up to date - Zostavax vaccine: Not applicable  SCREENING: -Mammogram: Not applicable  - Colonoscopy: Not applicable  - Bone Density: Not applicable  -Hearing Test: Not applicable  -Spirometry: Not applicable   PATIENT COUNSELING:   Advised to take 1 mg of folate supplement per day if capable of pregnancy.   Sexuality: Discussed sexually transmitted diseases, partner selection, use of condoms, avoidance of unintended pregnancy  and contraceptive alternatives.   Advised to avoid cigarette smoking.  I discussed with the patient that most people either abstain from alcohol or drink within safe limits (<=14/week and <=4 drinks/occasion for males, <=7/weeks and <= 3 drinks/occasion for females) and that the risk for alcohol disorders and other health effects rises proportionally with the number of drinks per week and how often a drinker exceeds daily limits.  Discussed cessation/primary prevention of drug use and availability of treatment for abuse.   Diet: Encouraged to adjust caloric intake to maintain  or achieve ideal body weight, to reduce intake of dietary saturated fat and total fat, to limit sodium intake by avoiding high sodium foods and not adding table salt, and to maintain adequate dietary potassium and calcium preferably from fresh fruits, vegetables, and low-fat dairy products.    Stressed the importance of regular exercise  Injury prevention: Discussed safety belts, safety helmets, smoke detector,  smoking near bedding or  upholstery.   Dental health: Discussed importance of regular tooth brushing, flossing, and dental visits.    NEXT PREVENTATIVE PHYSICAL DUE IN 1 YEAR. Return in about 6 weeks (around 10/19/2020) for Palpitations.

## 2020-09-07 NOTE — Patient Instructions (Signed)
Palpitations Palpitations are feelings that your heartbeat is not normal. Your heartbeat may feel like it is:  Uneven.  Faster than normal.  Fluttering.  Skipping a beat. This is usually not a serious problem. In some cases, you may need tests to rule out any serious problems. Follow these instructions at home: Pay attention to any changes in your condition. Take these actions to help manage your symptoms: Eating and drinking  Avoid: ? Coffee, tea, soft drinks, and energy drinks. ? Chocolate. ? Alcohol. ? Diet pills. Lifestyle  Try to lower your stress. These things can help you relax: ? Yoga. ? Deep breathing and meditation. ? Exercise. ? Using words and images to create positive thoughts (guided imagery). ? Using your mind to control things in your body (biofeedback).  Do not use drugs.  Get plenty of rest and sleep. Keep a regular bed time.   General instructions  Take over-the-counter and prescription medicines only as told by your doctor.  Do not use any products that contain nicotine or tobacco, such as cigarettes and e-cigarettes. If you need help quitting, ask your doctor.  Keep all follow-up visits as told by your doctor. This is important. You may need more tests if palpitations do not go away or get worse.   Contact a doctor if:  Your symptoms last more than 24 hours.  Your symptoms occur more often. Get help right away if you:  Have chest pain.  Feel short of breath.  Have a very bad headache.  Feel dizzy.  Pass out (faint). Summary  Palpitations are feelings that your heartbeat is uneven or faster than normal. It may feel like your heart is fluttering or skipping a beat.  Avoid food and drinks that may cause palpitations. These include caffeine, chocolate, and alcohol.  Try to lower your stress. Do not smoke or use drugs.  Get help right away if you faint or have chest pain, shortness of breath, a severe headache, or dizziness. This  information is not intended to replace advice given to you by your health care provider. Make sure you discuss any questions you have with your health care provider. Document Revised: 05/24/2017 Document Reviewed: 05/24/2017 Elsevier Patient Education  2021 [employer]  

## 2020-09-07 NOTE — Assessment & Plan Note (Signed)
Acute -- UA 2+ BLD (?menstrual cycle),  Trace LEUK, and neg NIT -- will send for culture and determine need for treatment upon return and abx to treat with.  Wet prep + for clue cells, neg trich and yeast.  Last BV infection 04/06/20 -- she will use Flagyl gel which she has at home.  Discussed at length with patient and she is in agreeance with plan.  She is aware PCP will reach out via MyChart with urine culture and any further steps needed.  Recommend increased hydration at home and ensuring partner does good hand washing prior to tactile stimulation with intercourse.  Return to office for worsening or ongoing symptoms.

## 2020-09-08 LAB — CBC WITH DIFFERENTIAL/PLATELET
Basophils Absolute: 0 10*3/uL (ref 0.0–0.2)
Basos: 1 %
EOS (ABSOLUTE): 0.3 10*3/uL (ref 0.0–0.4)
Eos: 4 %
Hematocrit: 41.7 % (ref 34.0–46.6)
Hemoglobin: 13.6 g/dL (ref 11.1–15.9)
Immature Grans (Abs): 0 10*3/uL (ref 0.0–0.1)
Immature Granulocytes: 0 %
Lymphocytes Absolute: 2.3 10*3/uL (ref 0.7–3.1)
Lymphs: 27 %
MCH: 30 pg (ref 26.6–33.0)
MCHC: 32.6 g/dL (ref 31.5–35.7)
MCV: 92 fL (ref 79–97)
Monocytes Absolute: 0.6 10*3/uL (ref 0.1–0.9)
Monocytes: 7 %
Neutrophils Absolute: 5.1 10*3/uL (ref 1.4–7.0)
Neutrophils: 61 %
Platelets: 270 10*3/uL (ref 150–450)
RBC: 4.53 x10E6/uL (ref 3.77–5.28)
RDW: 11.5 % — ABNORMAL LOW (ref 11.7–15.4)
WBC: 8.4 10*3/uL (ref 3.4–10.8)

## 2020-09-08 LAB — TSH: TSH: 0.561 u[IU]/mL (ref 0.450–4.500)

## 2020-09-08 LAB — HEPATITIS C ANTIBODY: Hep C Virus Ab: <0.1 s/co ratio (ref 0.0–0.9)

## 2020-09-08 LAB — COMPREHENSIVE METABOLIC PANEL
ALT: 9 IU/L (ref 0–32)
AST: 18 IU/L (ref 0–40)
Albumin/Globulin Ratio: 1.6 (ref 1.2–2.2)
Albumin: 4.5 g/dL (ref 3.9–5.0)
Alkaline Phosphatase: 94 IU/L (ref 44–121)
BUN/Creatinine Ratio: 12 (ref 9–23)
BUN: 8 mg/dL (ref 6–20)
Bilirubin Total: 0.4 mg/dL (ref 0.0–1.2)
CO2: 24 mmol/L (ref 20–29)
Calcium: 9.2 mg/dL (ref 8.7–10.2)
Chloride: 101 mmol/L (ref 96–106)
Creatinine, Ser: 0.69 mg/dL (ref 0.57–1.00)
Globulin, Total: 2.9 g/dL (ref 1.5–4.5)
Glucose: 73 mg/dL (ref 65–99)
Potassium: 3.8 mmol/L (ref 3.5–5.2)
Sodium: 139 mmol/L (ref 134–144)
Total Protein: 7.4 g/dL (ref 6.0–8.5)
eGFR: 125 mL/min/{1.73_m2} (ref >59)

## 2020-09-08 LAB — LIPID PANEL W/O CHOL/HDL RATIO
Cholesterol, Total: 180 mg/dL (ref 100–199)
HDL: 55 mg/dL (ref >39)
LDL Chol Calc (NIH): 106 mg/dL — ABNORMAL HIGH (ref 0–99)
Triglycerides: 103 mg/dL (ref 0–149)
VLDL Cholesterol Cal: 19 mg/dL (ref 5–40)

## 2020-09-08 LAB — WET PREP FOR TRICH, YEAST, CLUE
Clue Cell Exam: POSITIVE — AB
Trichomonas Exam: NEGATIVE
Yeast Exam: NEGATIVE

## 2020-09-08 LAB — URINALYSIS, ROUTINE W REFLEX MICROSCOPIC
Bilirubin, UA: NEGATIVE
Glucose, UA: NEGATIVE
Ketones, UA: NEGATIVE
Nitrite, UA: NEGATIVE
Protein,UA: NEGATIVE
Specific Gravity, UA: 1.02 (ref 1.005–1.030)
Urobilinogen, Ur: 0.2 mg/dL (ref 0.2–1.0)
pH, UA: 7 (ref 5.0–7.5)

## 2020-09-08 LAB — MICROSCOPIC EXAMINATION

## 2020-09-08 NOTE — Progress Notes (Signed)
Contacted via MyChart   Good afternoon Natalee, your blood work has returned and overall looks great with exception of mild elevation in LDL (bad cholesterol).  The LDL is the bad cholesterol. Over time and in combination with inflammation and other factors, this contributes to plaque which in turn may lead to stroke and/or heart attack down the road. Sometimes high LDL is primarily genetic, and people might be eating all the right foods but still have high numbers. Other times, there is room for improvement in one's diet and eating healthier can bring this number down and potentially reduce one's risk of heart attack and/or stroke.   To reduce your LDL, Remember - more fruits and vegetables, more fish, and limit red meat and dairy products. More soy, nuts, beans, barley, lentils, oats and plant sterol ester enriched margarine instead of butter. I also encourage eliminating sugar and processed food. Remember, shop on the outside of the grocery store and visit your [employer]. If you would like to talk with me about dietary changes for your cholesterol, please let me know. We should recheck your cholesterol in 12 months.  Any questions? Keep being awesome!!  Thank you for allowing me to participate in your care.  I appreciate you. Kindest regards, Sherley Mckenney

## 2020-09-09 LAB — GC/CHLAMYDIA PROBE AMP
Chlamydia trachomatis, NAA: NEGATIVE
Neisseria Gonorrhoeae by PCR: NEGATIVE

## 2020-09-09 LAB — URINE CULTURE: Organism ID, Bacteria: NO GROWTH

## 2020-09-09 NOTE — Progress Notes (Signed)
Contacted via MyChart   Urine culture showed no growth!!

## 2020-09-16 ENCOUNTER — Encounter: Payer: Self-pay | Admitting: Nurse Practitioner

## 2020-10-07 ENCOUNTER — Ambulatory Visit: Payer: Self-pay | Admitting: Nurse Practitioner

## 2020-10-21 ENCOUNTER — Ambulatory Visit: Payer: Self-pay | Admitting: Cardiovascular Disease

## 2020-10-21 ENCOUNTER — Ambulatory Visit: Payer: Self-pay | Admitting: Nurse Practitioner

## 2020-10-21 NOTE — Progress Notes (Deleted)
NO SHOW

## 2020-10-22 ENCOUNTER — Encounter: Payer: Self-pay | Admitting: Cardiovascular Disease

## 2020-12-02 ENCOUNTER — Other Ambulatory Visit: Payer: Self-pay

## 2020-12-02 ENCOUNTER — Ambulatory Visit (INDEPENDENT_AMBULATORY_CARE_PROVIDER_SITE_OTHER): Payer: Self-pay | Admitting: Internal Medicine

## 2020-12-02 ENCOUNTER — Ambulatory Visit (INDEPENDENT_AMBULATORY_CARE_PROVIDER_SITE_OTHER): Payer: Self-pay

## 2020-12-02 ENCOUNTER — Encounter: Payer: Self-pay | Admitting: Internal Medicine

## 2020-12-02 VITALS — BP 110/78 | HR 70 | Ht 61.0 in | Wt 133.0 lb

## 2020-12-02 DIAGNOSIS — R002 Palpitations: Secondary | ICD-10-CM

## 2020-12-02 NOTE — Progress Notes (Signed)
New Outpatient Visit Date: 12/02/2020  Referring Provider: Marjie Skiff, NP [address] [place],  [place] [address]  Chief Complaint: Palpitations and abnormal EKG  HPI:  Ms. Brouhard is a 25 y.o. female who is being seen today for the evaluation of palpitations and abnormal EKG at the request of Ms. Cannady. She has a history of asthma and anxiety.  She was seen in May by Ms. Cannady for evaluation of palpitations.  EKG was interpreted as abnormal with a short PR interval.  Today, Ms. Kaminski reports that she has felt palpitations since March or early April.  She describes a brief pause or fluttering in her chest for a few seconds.  She is wondered if this could be related to her anxiety.  Sometimes, she will go few weeks without symptoms and then have periods where the palpitations happen multiple times a day.  Most recent episode occurred yesterday.  She has mild shortness of breath that sometimes seems to be associated with the palpitations.  She also notices occasional soreness in her chest though it is unrelated to palpitations or specific activities.  She denies lightheadedness and edema.  Other than EKGs, she has not undergone further cardiac testing.  --------------------------------------------------------------------------------------------------  Cardiovascular History & Procedures: Cardiovascular Problems: Palpitations  Risk Factors: Prior tobacco use  Cath/PCI: None  CV Surgery: None  EP Procedures and Devices: None  Non-Invasive Evaluation(s): None  Recent CV Pertinent Labs: Lab Results  Component Value Date   CHOL 180 09/07/2020   HDL 55 09/07/2020   LDLCALC 106 (H) 09/07/2020   TRIG 103 09/07/2020   K 3.8 09/07/2020   BUN 8 09/07/2020   CREATININE 0.69 09/07/2020    --------------------------------------------------------------------------------------------------  Past Medical History:  Diagnosis Date   Anxiety    Asthma    BV (bacterial vaginosis)     History of twin pregnancy in prior pregnancy    Premature delivery    UTI (urinary tract infection)     Past Surgical History:  Procedure Laterality Date   CESAREAN SECTION N/A 01/10/2016   Procedure: CESAREAN SECTION;  Surgeon: Catalina Antigua, MD;  [facility]: [facility];  Service: Obstetrics;  Laterality: N/A;    Current Meds  Medication Sig   albuterol (PROVENTIL HFA;VENTOLIN HFA) 108 (90 Base) MCG/ACT inhaler Inhale 2 puffs into the lungs every 6 (six) hours as needed.   EPINEPHrine 0.3 mg/0.3 mL IJ SOAJ injection Inject 0.3 mg into the muscle as needed for anaphylaxis.     Allergies: Shellfish allergy  Social History   Tobacco Use   Smoking status: Former    Packs/day: 0.20    Years: 3.00    Pack years: 0.60    Types: Cigarettes    Quit date: 2020    Years since quitting: 2.6   Smokeless tobacco: Never  Vaping Use   Vaping Use: Never used  Substance Use Topics   Alcohol use: Yes    Alcohol/week: 1.0 standard drink    Types: 1 Standard drinks or equivalent per week   Drug use: Not Currently    Comment: on special occassions    Family History  Problem Relation Age of Onset   Hypertension Mother    Sickle cell trait Mother    Diabetes Mother    Cancer Paternal Uncle    Cancer Paternal Uncle    Cancer Paternal Uncle    Gout Maternal Grandfather    Hypertension Daughter        pulmonary   Bladder Cancer Neg  Hx    Kidney cancer Neg Hx     Review of Systems: A 12-system review of systems was performed and was negative except as noted in the HPI.  --------------------------------------------------------------------------------------------------  Physical Exam: BP 110/78 (BP Location: Right Arm, Patient Position: Sitting, Cuff Size: Normal)   Pulse 70   Ht 5\' 1"  (1.549 m)   Wt 133 lb (60.3 kg)   SpO2 99%   BMI 25.13 kg/m   General: NAD. HEENT: No conjunctival pallor or scleral icterus. Facemask in place. Neck: Supple without lymphadenopathy,  thyromegaly, JVD, or HJR. No carotid bruit. Lungs: Normal work of breathing. Clear to auscultation bilaterally without wheezes or crackles. Heart: Regular rate and rhythm without murmurs, rubs, or gallops. Non-displaced PMI. Abd: Bowel sounds present. Soft, NT/ND without hepatosplenomegaly Ext: No lower extremity edema. Radial, PT, and DP pulses are 2+ bilaterally Skin: Warm and dry without rash. Neuro: CNIII-XII intact. Strength and fine-touch sensation intact in upper and lower extremities bilaterally. Psych: Normal mood and affect.  EKG: Normal sinus rhythm without significant abnormality.  No significant change from prior tracings as recently as 09/07/2020.  Lab Results  Component Value Date   WBC 8.4 09/07/2020   HGB 13.6 09/07/2020   HCT 41.7 09/07/2020   MCV 92 09/07/2020   PLT 270 09/07/2020    Lab Results  Component Value Date   NA 139 09/07/2020   K 3.8 09/07/2020   CL 101 09/07/2020   CO2 24 09/07/2020   BUN 8 09/07/2020   CREATININE 0.69 09/07/2020   GLUCOSE 73 09/07/2020   ALT 9 09/07/2020    Lab Results  Component Value Date   CHOL 180 09/07/2020   HDL 55 09/07/2020   LDLCALC 106 (H) 09/07/2020   TRIG 103 09/07/2020   Lab Results  Component Value Date   TSH 0.561 09/07/2020    --------------------------------------------------------------------------------------------------  ASSESSMENT AND PLAN: Palpitations: Ms. Carnathan reports a ~4 history of intermittent palpitations.  Symptoms are most consistent with PACs or PVCs.  She notes mild associated shortness of breath but no other worrisome symptoms.  EKG today is unremarkable.  I have reviewed her prior tracings including the one from 09/07/2020 by her PCP.  PR interval appears to be around 120 ms which is at the lower Reegan Bouffard of normal.  I do not see any of further evidence of preexcitation.  We have discussed further work-up options and have agreed to obtain a 14-day event monitor.  If this is unrevealing, I  would not pursue additional cardiac work-up.  I encouraged Ms. Brave to stay well-hydrated and to minimize caffeine intake.  Follow-up: Return to clinic in 6 weeks.  09/09/2020, MD 12/02/2020 8:54 PM

## 2020-12-02 NOTE — Patient Instructions (Signed)
Medication Instructions:   Your physician recommends that you continue on your current medications as directed. Please refer to the Current Medication list given to you today.  *If you need a refill on your cardiac medications before your next appointment, please call your pharmacy*   Lab Work:  None ordered  Testing/Procedures:  Your physician has recommended that you wear a Zio XT monitor for TWO WEEKS.   This monitor is a medical device that records the heart's electrical activity. Doctors most often use these monitors to diagnose arrhythmias. Arrhythmias are problems with the speed or rhythm of the heartbeat. The monitor is a small device applied to your chest. You can wear one while you do your normal daily activities. While wearing this monitor if you have any symptoms to push the button and record what you felt. Once you have worn this monitor for the period of time provider prescribed (Usually 14 days), you will return the monitor device in the postage paid box. Once it is returned they will download the data collected and provide [place] with a report which the provider will then review and we will call you with those results. Important tips:  Avoid showering during the first 24 hours of wearing the monitor. Avoid excessive sweating to help maximize wear time. Do not submerge the device, no hot tubs, and no swimming pools. Keep any lotions or oils away from the patch. After 24 hours you may shower with the patch on. Take brief showers with your back facing the shower head.  Do not remove patch once it has been placed because that will interrupt data and decrease adhesive wear time. Push the button when you have any symptoms and write down what you were feeling. Once you have completed wearing your monitor, remove and place into box which has postage paid and place in your outgoing mailbox.  If for some reason you have misplaced your box then call our office and we can provide another box  and/or mail it off for you.    Follow-Up: At [facility], you and your health needs are our priority.  As part of our continuing mission to provide you with exceptional heart care, we have created designated Provider Care Teams.  These Care Teams include your primary Cardiologist (physician) and Advanced Practice Providers (APPs -  Physician Assistants and Nurse Practitioners) who all work together to provide you with the care you need, when you need it.  We recommend signing up for the patient portal called "MyChart".  Sign up information is provided on this After Visit Summary.  MyChart is used to connect with patients for Virtual Visits (Telemedicine).  Patients are able to view lab/test results, encounter notes, upcoming appointments, etc.  Non-urgent messages can be sent to your provider as well.   To learn more about what you can do with MyChart, go to [email or web address].    Your next appointment:   6 week(s)  The format for your next appointment:   In Person  Provider:   You may see Yvonne Kendall, MD or one of the following Advanced Practice Providers on your designated Care Team:   Nicolasa Ducking, NP Eula Listen, PA-C Marisue Ivan, PA-C [place], [place]

## 2021-01-05 ENCOUNTER — Ambulatory Visit: Payer: Self-pay | Admitting: Nurse Practitioner

## 2021-01-07 ENCOUNTER — Ambulatory Visit (INDEPENDENT_AMBULATORY_CARE_PROVIDER_SITE_OTHER): Payer: Self-pay | Admitting: Nurse Practitioner

## 2021-01-07 ENCOUNTER — Other Ambulatory Visit: Payer: Self-pay

## 2021-01-07 ENCOUNTER — Encounter: Payer: Self-pay | Admitting: Nurse Practitioner

## 2021-01-07 VITALS — BP 113/76 | HR 67 | Temp 99.3°F | Wt 131.6 lb

## 2021-01-07 DIAGNOSIS — R21 Rash and other nonspecific skin eruption: Secondary | ICD-10-CM

## 2021-01-07 MED ORDER — ACYCLOVIR 400 MG PO TABS: 400.0000 mg | ORAL_TABLET | Freq: Every day | ORAL | 0 refills | Status: DC

## 2021-01-07 MED ORDER — PREDNISONE 20 MG PO TABS: 20.0000 mg | ORAL_TABLET | Freq: Every day | ORAL | 0 refills | Status: DC

## 2021-01-07 NOTE — Progress Notes (Signed)
Acute Office Visit  Subjective:    Patient ID: Allison Sanchez, female    DOB: Jan 09, 1996, 25 y.o.   MRN: [id number]  Chief Complaint  Patient presents with   Rash    Pt states she has had a rash all over for the last 2 to 3 weeks. States that it does not itch very much    HPI Patient is in today for a rash that started last 3 weeks. She thought it might have been from lexapro that she started 2 weeks prior to that. She stopped the lexapro and took benadryl. She states the rash is now worsening. She is going to be in a wedding this weekend and is asking if there is anything that can help the rash go away.   RASH Duration:  weeks  Location: generalized  Itching: yes Burning: no Redness: yes Oozing: no Scaling: yes Blisters: no Painful: no Fevers: no Change in detergents/soaps/personal care products: no Recent illness: no Recent travel:no History of same: no Context: worse Alleviating factors: nothing Treatments attempted:hydrocortisone cream and benadryl Shortness of breath: no  Throat/tongue swelling: no Myalgias/arthralgias: no   Past Medical History:  Diagnosis Date   Anxiety    Asthma    BV (bacterial vaginosis)    History of twin pregnancy in prior pregnancy    Premature delivery    UTI (urinary tract infection)     Past Surgical History:  Procedure Laterality Date   CESAREAN SECTION N/A 01/10/2016   Procedure: CESAREAN SECTION;  Surgeon: Mora Bellman, MD;  Location: [facility];  Service: Obstetrics;  Laterality: N/A;    Family History  Problem Relation Age of Onset   Hypertension Mother    Sickle cell trait Mother    Diabetes Mother    Cancer Paternal Uncle    Cancer Paternal Uncle    Cancer Paternal Uncle    Gout Maternal Grandfather    Hypertension Daughter        pulmonary   Bladder Cancer Neg Hx    Kidney cancer Neg Hx     Social History   Socioeconomic History   Marital status: Single    Spouse name: Not on file   Number  of children: Not on file   Years of education: Not on file   Highest education level: Not on file  Occupational History   Not on file  Tobacco Use   Smoking status: Former    Packs/day: 0.20    Years: 3.00    Pack years: 0.60    Types: Cigarettes    Quit date: 2020    Years since quitting: 2.7   Smokeless tobacco: Never  Vaping Use   Vaping Use: Never used  Substance and Sexual Activity   Alcohol use: Yes    Alcohol/week: 1.0 standard drink    Types: 1 Standard drinks or equivalent per week   Drug use: Not Currently    Comment: on special occassions   Sexual activity: Yes    Birth control/protection: None  Other Topics Concern   Not on file  Social History Narrative   Not on file   Social Determinants of Health   Financial Resource Strain: Not on file  Food Insecurity: Not on file  Transportation Needs: Not on file  Physical Activity: Not on file  Stress: Not on file  Social Connections: Not on file  Intimate Partner Violence: Not on file    Outpatient Medications Prior to Visit  Medication Sig Dispense Refill   albuterol (  PROVENTIL HFA;VENTOLIN HFA) 108 (90 Base) MCG/ACT inhaler Inhale 2 puffs into the lungs every 6 (six) hours as needed. 1 Inhaler 2   EPINEPHrine 0.3 mg/0.3 mL IJ SOAJ injection Inject 0.3 mg into the muscle as needed for anaphylaxis.      No facility-administered medications prior to visit.    Allergies  Allergen Reactions   Shellfish Allergy Swelling    Review of Systems  Constitutional: Negative.   Respiratory: Negative.    Cardiovascular: Negative.   Gastrointestinal: Negative.   Genitourinary: Negative.   Musculoskeletal: Negative.   Skin:  Positive for rash.  Neurological: Negative.       Objective:    Physical Exam Vitals and nursing note reviewed.  Constitutional:      General: She is not in acute distress.    Appearance: Normal appearance.  HENT:     Head: Normocephalic.  Eyes:     Conjunctiva/sclera: Conjunctivae  normal.  Cardiovascular:     Rate and Rhythm: Normal rate and regular rhythm.     Pulses: Normal pulses.     Heart sounds: Normal heart sounds.  Pulmonary:     Effort: Pulmonary effort is normal.     Breath sounds: Normal breath sounds.  Musculoskeletal:     Cervical back: Normal range of motion.  Skin:    General: Skin is warm.     Findings: Rash present.     Comments: Multiple areas of flat, scaly, round lesions to her abdomen, breasts, back, groin with one larger lesion on her left breast. Small papular areas to her arms.   Neurological:     General: No focal deficit present.     Mental Status: She is alert and oriented to person, place, and time.  Psychiatric:        Mood and Affect: Mood normal.        Behavior: Behavior normal.        Thought Content: Thought content normal.        Judgment: Judgment normal.    BP 113/76   Pulse 67   Temp 99.3 F (37.4 C) (Oral)   Wt 131 lb 9.6 oz (59.7 kg)   SpO2 99%   BMI 24.87 kg/m  Wt Readings from Last 3 Encounters:  01/07/21 131 lb 9.6 oz (59.7 kg)  12/02/20 133 lb (60.3 kg)  09/07/20 130 lb 9.6 oz (59.2 kg)    There are no preventive care reminders to display for this patient.  There are no preventive care reminders to display for this patient.   Lab Results  Component Value Date   TSH 0.561 09/07/2020   Lab Results  Component Value Date   WBC 8.4 09/07/2020   HGB 13.6 09/07/2020   HCT 41.7 09/07/2020   MCV 92 09/07/2020   PLT 270 09/07/2020   Lab Results  Component Value Date   NA 139 09/07/2020   K 3.8 09/07/2020   CO2 24 09/07/2020   GLUCOSE 73 09/07/2020   BUN 8 09/07/2020   CREATININE 0.69 09/07/2020   BILITOT 0.4 09/07/2020   ALKPHOS 94 09/07/2020   AST 18 09/07/2020   ALT 9 09/07/2020   PROT 7.4 09/07/2020   ALBUMIN 4.5 09/07/2020   CALCIUM 9.2 09/07/2020   ANIONGAP 17 (H) 08/12/2019   EGFR 125 09/07/2020   Lab Results  Component Value Date   CHOL 180 09/07/2020   Lab Results  Component  Value Date   HDL 55 09/07/2020   Lab Results  Component Value Date  [place] 106 (H) 09/07/2020   Lab Results  Component Value Date   TRIG 103 09/07/2020   No results found for: CHOLHDL No results found for: HGBA1C     Assessment & Plan:   Problem List Items Addressed This Visit   None Visit Diagnoses     Pityriasis rosea-like skin eruption    -  Primary   Started with one large patch, then spread. Will treat with prednisone and acyclovir to help symptoms resolve quicker. F/U if symptoms worsen or don't improve        Meds ordered this encounter  Medications   predniSONE (DELTASONE) 20 MG tablet    Sig: Take 1 tablet (20 mg total) by mouth daily with breakfast.    Dispense:  5 tablet    Refill:  0   acyclovir (ZOVIRAX) 400 MG tablet    Sig: Take 1 tablet (400 mg total) by mouth 5 (five) times daily.    Dispense:  35 tablet    Refill:  0     Charyl Dancer, NP

## 2021-01-07 NOTE — Patient Instructions (Signed)
Pityriasis rosea  What is pityriasis rosea? Pityriasis rosea is a harmless skin rash that causes small, itchy spots on the belly, back, chest, arms, and legs. The rash usually lasts about 4 to 6 weeks, but in some people, it can last for months. Pityriasis rosea is most common in older children and young adults. What causes pityriasis rosea? The cause is not known. But it does not seem to be easily spread from person to person. What are the symptoms of pityriasis rosea? In many people, the rash starts with one round or oval patch. This spot is about the size of a half dollar but might be larger. A day or two later, many smaller spots about the size of a dime appear. But not everyone gets the large spot before the rest of the rash. If you have light skin, the spots are usually pink or salmon-colored. If you have dark skin, the spots can be a red-brown color or darker than your skin (picture 1 and picture 2). The spots might be: ?On the belly, back, chest, arms, and legs ?Spread out in a "fir tree" or "Christmas tree" pattern on the back ?Itchy ?A little scaly In children, the spots sometimes happen on the face and scalp. Is there a test for pityriasis rosea? Maybe. Your doctor or nurse will often be able to tell if you have it by learning about your symptoms and doing an exam. But they might gently scrape the rash or do a different test to get a sample of your skin. Tests on the skin sample can help the doctor tell if you have pityriasis rosea or a different disease. Is there anything I can do on my own to feel better? Yes. You can: ?Take a special kind of bath called an oatmeal bath. Use lukewarm, not hot water. ?Use unscented moisturizing lotion or cream on your skin. ?Try to keep your body cool. How is pityriasis rosea treated? Most people do not need any treatment. If the symptoms bother you, your doctor might prescribe creams or ointments to help with itching. In rare cases, doctors  prescribe other medicines or a special type of treatment that uses lights, called "phototherapy."

## 2021-01-12 NOTE — Progress Notes (Deleted)
Cardiology Office Note    Date:  01/12/2021   ID:  Allison Sanchez, DOB Apr 02, 1996, MRN [id number]  PCP:  Marjie Skiff, NP  Cardiologist:  Yvonne Kendall, MD  Electrophysiologist:  None   Chief Complaint: ***  History of Present Illness:   Allison Sanchez is a 25 y.o. female with history of ***  ***   Labs independently reviewed: 08/2020 - TSH normal, TC 180, TG 103, HDL 55, LDL 106, Hgb 13.6, PLT 270, BUN 8, serum creatinine 0.69, potassium 3.8, albumin 4.5, AST/ALT normal  Past Medical History:  Diagnosis Date   Anxiety    Asthma    BV (bacterial vaginosis)    History of twin pregnancy in prior pregnancy    Premature delivery    UTI (urinary tract infection)     Past Surgical History:  Procedure Laterality Date   CESAREAN SECTION N/A 01/10/2016   Procedure: CESAREAN SECTION;  Surgeon: Catalina Antigua, MD;  [facility]: [facility];  Service: Obstetrics;  Laterality: N/A;    Current Medications: No outpatient medications have been marked as taking for the 01/14/21 encounter (Appointment) with Sondra Barges, PA-C.    Allergies:   Shellfish allergy   Social History   Socioeconomic History   Marital status: Single    Spouse name: Not on file   Number of children: Not on file   Years of education: Not on file   Highest education level: Not on file  Occupational History   Not on file  Tobacco Use   Smoking status: Former    Packs/day: 0.20    Years: 3.00    Pack years: 0.60    Types: Cigarettes    Quit date: 2020    Years since quitting: 2.7   Smokeless tobacco: Never  Vaping Use   Vaping Use: Never used  Substance and Sexual Activity   Alcohol use: Yes    Alcohol/week: 1.0 standard drink    Types: 1 Standard drinks or equivalent per week   Drug use: Not Currently    Comment: on special occassions   Sexual activity: Yes    Birth control/protection: None  Other Topics Concern   Not on file  Social History Narrative   Not on file   Social  Determinants of Health   Financial Resource Strain: Not on file  Food Insecurity: Not on file  Transportation Needs: Not on file  Physical Activity: Not on file  Stress: Not on file  Social Connections: Not on file     Family History:  The patient's family history includes Cancer in her paternal uncle, paternal uncle, and paternal uncle; Diabetes in her mother; Gout in her maternal grandfather; Hypertension in her daughter and mother; Sickle cell trait in her mother. There is no history of Bladder Cancer or Kidney cancer.  ROS:   ROS   EKGs/Labs/Other Studies Reviewed:    Studies reviewed were summarized above. The additional studies were reviewed today:  Zio patch 11/2020: The patient was monitored for 13 days, 21 hours. The predominant rhythm was sinus with an average rate of 82 bpm (range 54-164 bpm). There were rare PACs and PVCs. No sustained arrhythmia or prolonged pause was observed. Patient triggered events correspond to sinus rhythm, sinus rhythm with PACs, and sinus rhythm with PVCs.   Predominantly sinus rhythm with rare PACs and PVCs.    EKG:  EKG is ordered today.  The EKG ordered today demonstrates ***  Recent Labs: 09/07/2020: ALT 9; BUN 8; Creatinine,  Ser 0.69; Hemoglobin 13.6; Platelets 270; Potassium 3.8; Sodium 139; TSH 0.561  Recent Lipid Panel    Component Value Date/Time   CHOL 180 09/07/2020 1516   TRIG 103 09/07/2020 1516   HDL 55 09/07/2020 1516   LDLCALC 106 (H) 09/07/2020 1516    PHYSICAL EXAM:    VS:  There were no vitals taken for this visit.  BMI: There is no height or weight on file to calculate BMI.  Physical Exam  Wt Readings from Last 3 Encounters:  01/07/21 131 lb 9.6 oz (59.7 kg)  12/02/20 133 lb (60.3 kg)  09/07/20 130 lb 9.6 oz (59.2 kg)     ASSESSMENT & PLAN:   ***  Disposition: F/u with Dr. Okey Dupre or an APP in ***.   Medication Adjustments/Labs and Tests Ordered: Current medicines are reviewed at length with the  patient today.  Concerns regarding medicines are outlined above. Medication changes, Labs and Tests ordered today are summarized above and listed in the Patient Instructions accessible in Encounters.   Signed, Eula Listen, PA-C 01/12/2021 2:24 PM     [facility] [address] [place], [place] [address] [phone]

## 2021-01-14 ENCOUNTER — Ambulatory Visit: Payer: Self-pay | Admitting: Physician Assistant

## 2021-01-19 NOTE — Telephone Encounter (Signed)
Patient is scheduled for 10/11.  Called patient and advised of sooner appointment available but she will keep the same appointment unless symptoms worsen.

## 2021-01-28 NOTE — Progress Notes (Signed)
Cardiology Office Note:    Date:  01/29/2021   ID:  HOLLY IANNACCONE, DOB 1995-06-26, MRN [id number]  PCP:  Marjie Skiff, NP  [facility] Cardiologist:  Yvonne Kendall, MD  [facility] Electrophysiologist:  None   Referring MD: Marjie Skiff, NP   Chief Complaint: 6 week follow-up  History of Present Illness:    Allison Sanchez is a 25 y.o. female with a hx of palpitations, asthma, anxiety who is being seen for follow-up.   The patient was seen 12/02/2020 for palpitations with symptoms felt to be consistent with PACs PVCs.  She was unremarkable.  A heart monitor was ordered.  Heart monitor showed predominantly sinus rhythm with an average rate of 82 bpm, rare PACs and PVCs, no prolonged pauses, patient triggered events corresponded to sinus rhythm with PACs and PVCs.  Today, the patient reports she is still having palpitations. They occur when she gets upset. Still takes a while to calm down. She had a lot on Tuesday and on Wednesday. She had gotten into a big disagreement with fiance. No cehst pain, sob, LLE, orthopnea, pnd. Haven't felt any today.   Past Medical History:  Diagnosis Date   Anxiety    Asthma    BV (bacterial vaginosis)    History of twin pregnancy in prior pregnancy    Premature delivery    UTI (urinary tract infection)     Past Surgical History:  Procedure Laterality Date   CESAREAN SECTION N/A 01/10/2016   Procedure: CESAREAN SECTION;  Surgeon: Catalina Antigua, MD;  Location: [facility];  Service: Obstetrics;  Laterality: N/A;    Current Medications: Current Meds  Medication Sig   albuterol (PROVENTIL HFA;VENTOLIN HFA) 108 (90 Base) MCG/ACT inhaler Inhale 2 puffs into the lungs every 6 (six) hours as needed.   EPINEPHrine 0.3 mg/0.3 mL IJ SOAJ injection Inject 0.3 mg into the muscle as needed for anaphylaxis.    escitalopram (LEXAPRO) 10 MG tablet Take 10 mg by mouth daily.     Allergies:   Shellfish allergy   Social History    Socioeconomic History   Marital status: Single    Spouse name: Not on file   Number of children: Not on file   Years of education: Not on file   Highest education level: Not on file  Occupational History   Not on file  Tobacco Use   Smoking status: Former    Packs/day: 0.20    Years: 3.00    Pack years: 0.60    Types: Cigarettes    Quit date: 2020    Years since quitting: 2.7   Smokeless tobacco: Never  Vaping Use   Vaping Use: Never used  Substance and Sexual Activity   Alcohol use: Yes    Alcohol/week: 1.0 standard drink    Types: 1 Standard drinks or equivalent per week   Drug use: Not Currently    Comment: on special occassions   Sexual activity: Yes    Birth control/protection: None  Other Topics Concern   Not on file  Social History Narrative   Not on file   Social Determinants of Health   Financial Resource Strain: Not on file  Food Insecurity: Not on file  Transportation Needs: Not on file  Physical Activity: Not on file  Stress: Not on file  Social Connections: Not on file     Family History: The patient's family history includes Cancer in her paternal uncle, paternal uncle, and paternal uncle; Diabetes in her  mother; Gout in her maternal grandfather; Hypertension in her daughter and mother; Sickle cell trait in her mother. There is no history of Bladder Cancer or Kidney cancer.  ROS:   Please see the history of present illness.     All other systems reviewed and are negative.  EKGs/Labs/Other Studies Reviewed:    The following studies were reviewed today:  Heart monitor 12/12/2020 The patient was monitored for 13 days, 21 hours. The predominant rhythm was sinus with an average rate of 82 bpm (range 54-164 bpm). There were rare PACs and PVCs. No sustained arrhythmia or prolonged pause was observed. Patient triggered events correspond to sinus rhythm, sinus rhythm with PACs, and sinus rhythm with PVCs.   Predominantly sinus rhythm with rare PACs  and PVCs.  EKG:  EKG is ordered today.  It shows NSR 63bpm, nonspecific T wave abnormality  Recent Labs: 09/07/2020: ALT 9; BUN 8; Creatinine, Ser 0.69; Hemoglobin 13.6; Platelets 270; Potassium 3.8; Sodium 139; TSH 0.561  Recent Lipid Panel    Component Value Date/Time   CHOL 180 09/07/2020 1516   TRIG 103 09/07/2020 1516   HDL 55 09/07/2020 1516   LDLCALC 106 (H) 09/07/2020 1516     Physical Exam:    VS:  BP 100/60 (BP Location: Left Arm, Patient Position: Sitting, Cuff Size: Normal)   Pulse 63   Ht 5\' 1"  (1.549 m)   Wt 134 lb 6 oz (61 kg)   SpO2 98%   BMI 25.39 kg/m     Wt Readings from Last 3 Encounters:  01/29/21 134 lb 6 oz (61 kg)  01/07/21 131 lb 9.6 oz (59.7 kg)  12/02/20 133 lb (60.3 kg)     GEN:  Well nourished, well developed in no acute distress HEENT: Normal NECK: No JVD; No carotid bruits LYMPHATICS: No lymphadenopathy CARDIAC: RRR, no murmurs, rubs, gallops RESPIRATORY:  Clear to auscultation without rales, wheezing or rhonchi  ABDOMEN: Soft, non-tender, non-distended MUSCULOSKELETAL:  No edema; No deformity  SKIN: Warm and dry NEUROLOGIC:  Alert and oriented x 3 PSYCHIATRIC:  Normal affect   ASSESSMENT:    1. Palpitations   2. PVC (premature ventricular contraction)    PLAN:    In order of problems listed above:  Palpitations Heart monitor reviewed in detail (report above). Patient reports she still has rare palpitations, seem to be when she gets upset, however can occur randomly. She is drinking and eating normally. Does not drink too much caffeine. Lifestyle changes discussed in detail to hopefully reduce stress and palpitations. Patient wondering about blood work, will get BMET and MAG. TSH from May 2022 was normal. EKG today with NSR. We will try diltiazem 30mg  to use PRN for palpitations and reassess symptoms in 3 months.   Disposition: Follow up in 3 month(s) with MD/APP   Signed, Ahlana Slaydon June 2022, PA-C  01/29/2021 8:25 AM    [facility]

## 2021-01-29 ENCOUNTER — Encounter: Payer: Self-pay | Admitting: Medical

## 2021-01-29 ENCOUNTER — Other Ambulatory Visit: Payer: Self-pay

## 2021-01-29 ENCOUNTER — Ambulatory Visit (INDEPENDENT_AMBULATORY_CARE_PROVIDER_SITE_OTHER): Payer: Self-pay | Admitting: Medical

## 2021-01-29 VITALS — BP 100/60 | HR 63 | Ht 61.0 in | Wt 134.4 lb

## 2021-01-29 DIAGNOSIS — I493 Ventricular premature depolarization: Secondary | ICD-10-CM

## 2021-01-29 DIAGNOSIS — R002 Palpitations: Secondary | ICD-10-CM

## 2021-01-29 MED ORDER — DILTIAZEM HCL 30 MG PO TABS: 30.0000 mg | ORAL_TABLET | ORAL | 0 refills | Status: DC | PRN

## 2021-01-29 NOTE — Patient Instructions (Addendum)
Medication Instructions:  Your physician has recommended you make the following change in your medication:   START Diltiazem 30 mg taking only as needed     *If you need a refill on your cardiac medications before your next appointment, please call your pharmacy*   Lab Work: TODAY:  BMET & MAG  If you have labs (blood work) drawn today and your tests are completely normal, you will receive your results only by: MyChart Message (if you have MyChart) OR A paper copy in the mail If you have any lab test that is abnormal or we need to change your treatment, we will call you to review the results.   Testing/Procedures: None ordered   Follow-Up: At [facility], you and your health needs are our priority.  As part of our continuing mission to provide you with exceptional heart care, we have created designated Provider Care Teams.  These Care Teams include your primary Cardiologist (physician) and Advanced Practice Providers (APPs -  Physician Assistants and Nurse Practitioners) who all work together to provide you with the care you need, when you need it.  We recommend signing up for the patient portal called "MyChart".  Sign up information is provided on this After Visit Summary.  MyChart is used to connect with patients for Virtual Visits (Telemedicine).  Patients are able to view lab/test results, encounter notes, upcoming appointments, etc.  Non-urgent messages can be sent to your provider as well.   To learn more about what you can do with MyChart, go to [email or web address].    Your next appointment:   3 month(s)  The format for your next appointment:   In Person  Provider:   You may see Yvonne Kendall, MD or one of the following Advanced Practice Providers on your designated Care Team:   Nicolasa Ducking, NP Eula Listen, PA-C Marisue Ivan, PA-C Cadence Fransico Michael, [place]   Other Instructions

## 2021-01-30 LAB — MAGNESIUM: Magnesium: 2 mg/dL (ref 1.6–2.3)

## 2021-01-30 LAB — BASIC METABOLIC PANEL
BUN/Creatinine Ratio: 14 (ref 9–23)
BUN: 11 mg/dL (ref 6–20)
CO2: 22 mmol/L (ref 20–29)
Calcium: 9.5 mg/dL (ref 8.7–10.2)
Chloride: 104 mmol/L (ref 96–106)
Creatinine, Ser: 0.76 mg/dL (ref 0.57–1.00)
Glucose: 84 mg/dL (ref 70–99)
Potassium: 4.2 mmol/L (ref 3.5–5.2)
Sodium: 140 mmol/L (ref 134–144)
eGFR: 113 mL/min/{1.73_m2} (ref >59)

## 2021-02-02 ENCOUNTER — Ambulatory Visit: Payer: Self-pay | Admitting: Nurse Practitioner

## 2021-02-12 ENCOUNTER — Other Ambulatory Visit: Payer: Self-pay | Admitting: Medical

## 2021-02-25 ENCOUNTER — Encounter: Payer: Self-pay | Admitting: Nurse Practitioner

## 2021-02-25 ENCOUNTER — Ambulatory Visit (INDEPENDENT_AMBULATORY_CARE_PROVIDER_SITE_OTHER): Payer: Self-pay | Admitting: Nurse Practitioner

## 2021-02-25 ENCOUNTER — Other Ambulatory Visit: Payer: Self-pay

## 2021-02-25 VITALS — BP 99/64 | HR 81 | Temp 98.1°F | Ht 61.0 in | Wt 133.4 lb

## 2021-02-25 DIAGNOSIS — Z202 Contact with and (suspected) exposure to infections with a predominantly sexual mode of transmission: Secondary | ICD-10-CM

## 2021-02-25 NOTE — Progress Notes (Signed)
Acute Office Visit  Subjective:    Patient ID: Allison Sanchez, female    DOB: 1996-01-20, 25 y.o.   MRN: [id number]  Chief Complaint  Patient presents with   Exposure to STD    Exposure mid September to early October. Unprotected sexual intercourse.     HPI Patient is in today for possible exposure to chlamydia in September to early October. She states she had unprotected intercourse with a female who had been exposed to chlamydia. She denies any symptoms of dysuria, vaginal itching, vaginal discharge, and sores. She was treat for a rash on 01/07/21. She is not on birth control and states she is not concerned for pregnancy.   Past Medical History:  Diagnosis Date   Anxiety    Asthma    BV (bacterial vaginosis)    History of twin pregnancy in prior pregnancy    Premature delivery    UTI (urinary tract infection)     Past Surgical History:  Procedure Laterality Date   CESAREAN SECTION N/A 01/10/2016   Procedure: CESAREAN SECTION;  Surgeon: Mora Bellman, MD;  Location: [place];  Service: Obstetrics;  Laterality: N/A;    Family History  Problem Relation Age of Onset   Hypertension Mother    Sickle cell trait Mother    Diabetes Mother    Cancer Paternal Uncle    Cancer Paternal Uncle    Cancer Paternal Uncle    Gout Maternal Grandfather    Hypertension Daughter        pulmonary   Bladder Cancer Neg Hx    Kidney cancer Neg Hx     Social History   Socioeconomic History   Marital status: Single    Spouse name: Not on file   Number of children: Not on file   Years of education: Not on file   Highest education level: Not on file  Occupational History   Not on file  Tobacco Use   Smoking status: Former    Packs/day: 0.20    Years: 3.00    Pack years: 0.60    Types: Cigarettes    Quit date: 2020    Years since quitting: 2.8   Smokeless tobacco: Never  Vaping Use   Vaping Use: Never used  Substance and Sexual Activity   Alcohol use: Yes     Alcohol/week: 1.0 standard drink    Types: 1 Standard drinks or equivalent per week   Drug use: Not Currently    Comment: on special occassions   Sexual activity: Yes    Birth control/protection: None  Other Topics Concern   Not on file  Social History Narrative   Not on file   Social Determinants of Health   Financial Resource Strain: Not on file  Food Insecurity: Not on file  Transportation Needs: Not on file  Physical Activity: Not on file  Stress: Not on file  Social Connections: Not on file  Intimate Partner Violence: Not on file    Outpatient Medications Prior to Visit  Medication Sig Dispense Refill   albuterol (PROVENTIL HFA;VENTOLIN HFA) 108 (90 Base) MCG/ACT inhaler Inhale 2 puffs into the lungs every 6 (six) hours as needed. 1 Inhaler 2   EPINEPHrine 0.3 mg/0.3 mL IJ SOAJ injection Inject 0.3 mg into the muscle as needed for anaphylaxis.      escitalopram (LEXAPRO) 10 MG tablet Take 10 mg by mouth daily.     diltiazem (CARDIZEM) 30 MG tablet Take 1 tablet (30 mg total) by mouth as  needed (palpitations). 30 tablet 0   No facility-administered medications prior to visit.    Allergies  Allergen Reactions   Shellfish Allergy Swelling    Review of Systems  Constitutional: Negative.   HENT: Negative.    Eyes: Negative.   Respiratory: Negative.    Cardiovascular: Negative.   Gastrointestinal: Negative.   Endocrine: Negative.   Genitourinary: Negative.   Musculoskeletal: Negative.   Skin: Negative.   Neurological: Negative.   Psychiatric/Behavioral: Negative.        Objective:    Physical Exam Vitals and nursing note reviewed.  Constitutional:      General: She is not in acute distress.    Appearance: Normal appearance.  HENT:     Head: Normocephalic.  Eyes:     Conjunctiva/sclera: Conjunctivae normal.  Cardiovascular:     Rate and Rhythm: Normal rate.     Pulses: Normal pulses.  Pulmonary:     Effort: Pulmonary effort is normal.   Musculoskeletal:     Cervical back: Normal range of motion.  Skin:    General: Skin is warm.  Neurological:     General: No focal deficit present.     Mental Status: She is alert and oriented to person, place, and time.  Psychiatric:        Mood and Affect: Mood normal.        Behavior: Behavior normal.        Thought Content: Thought content normal.        Judgment: Judgment normal.    BP 99/64   Pulse 81   Temp 98.1 F (36.7 C) (Oral)   Ht '5\' 1"'  (1.549 m)   Wt 133 lb 6 oz (60.5 kg)   BMI 25.20 kg/m  Wt Readings from Last 3 Encounters:  02/25/21 133 lb 6 oz (60.5 kg)  01/29/21 134 lb 6 oz (61 kg)  01/07/21 131 lb 9.6 oz (59.7 kg)    Health Maintenance Due  Topic Date Due   COVID-19 Vaccine (1) Never done    There are no preventive care reminders to display for this patient.   Lab Results  Component Value Date   TSH 0.561 09/07/2020   Lab Results  Component Value Date   WBC 8.4 09/07/2020   HGB 13.6 09/07/2020   HCT 41.7 09/07/2020   MCV 92 09/07/2020   PLT 270 09/07/2020   Lab Results  Component Value Date   NA 140 01/29/2021   K 4.2 01/29/2021   CO2 22 01/29/2021   GLUCOSE 84 01/29/2021   BUN 11 01/29/2021   CREATININE 0.76 01/29/2021   BILITOT 0.4 09/07/2020   ALKPHOS 94 09/07/2020   AST 18 09/07/2020   ALT 9 09/07/2020   PROT 7.4 09/07/2020   ALBUMIN 4.5 09/07/2020   CALCIUM 9.5 01/29/2021   ANIONGAP 17 (H) 08/12/2019   EGFR 113 01/29/2021   Lab Results  Component Value Date   CHOL 180 09/07/2020   Lab Results  Component Value Date   HDL 55 09/07/2020   Lab Results  Component Value Date   LDLCALC 106 (H) 09/07/2020   Lab Results  Component Value Date   TRIG 103 09/07/2020   No results found for: CHOLHDL No results found for: HGBA1C     Assessment & Plan:   Problem List Items Addressed This Visit   None Visit Diagnoses     Possible exposure to STD    -  Primary   Possible exposure to chlamydia. Will check STD panel  today.  Discussed safe sex and condom usage. Treat based on results.    Relevant Orders   HIV Antibody (routine testing w rflx)   RPR   HSV(herpes simplex vrs) 1+2 ab-IgG   Acute Viral Hepatitis (HAV, HBV, HCV)   Chlamydia/GC NAA, Confirmation        No orders of the defined types were placed in this encounter.    Charyl Dancer, NP

## 2021-02-26 LAB — HCV INTERPRETATION

## 2021-02-26 LAB — HSV(HERPES SIMPLEX VRS) I + II AB-IGG
HSV 1 Glycoprotein G Ab, IgG: <0.91 index (ref 0.00–0.90)
HSV 2 IgG, Type Spec: <0.91 index (ref 0.00–0.90)

## 2021-02-26 LAB — ACUTE VIRAL HEPATITIS (HAV, HBV, HCV)
HCV Ab: <0.1 s/co ratio (ref 0.0–0.9)
Hep A IgM: NEGATIVE
Hep B C IgM: NEGATIVE
Hepatitis B Surface Ag: NEGATIVE

## 2021-02-26 LAB — RPR: RPR Ser Ql: NONREACTIVE

## 2021-02-26 LAB — HIV ANTIBODY (ROUTINE TESTING W REFLEX): HIV Screen 4th Generation wRfx: NONREACTIVE

## 2021-02-28 LAB — CHLAMYDIA/GC NAA, CONFIRMATION
Chlamydia trachomatis, NAA: NEGATIVE
Neisseria gonorrhoeae, NAA: NEGATIVE

## 2021-04-12 NOTE — Progress Notes (Signed)
Acute Office Visit  Subjective:    Patient ID: Allison Sanchez, female    DOB: [birth year], 25 y.o.   MRN: [id number]  Chief Complaint  Patient presents with   burning when urinating    Urinary pressure.  Started about 2 weeks ago.    HPI Patient is in today for burning with urination and urinary pressure for 2 weeks. She saw urology in April 2022 and was placed on a 3 month course of macrobid. This is the first symptoms of UTI since completing this 3 month course of antibiotics. She also states she feels like she has a BV infection. She gets them frequently and had leftover metronidazole gel from a prior infection that she has been using nightly. She also had been using boric acid suppository nightly but had weaned herself off as she wasn't sure how long you could take them for.   URINARY SYMPTOMS  Dysuria: burning Urinary frequency: no Urgency: no Small volume voids: no Symptom severity:  mild Urinary incontinence: no Foul odor: yes Hematuria: no - however on menstrual cycle Abdominal pain: no Back pain: no Suprapubic pain/pressure: no Flank pain: no Fever:  no Vomiting: no Relief with cranberry juice:  n/a Relief with pyridium:  n/a Status: worse Previous urinary tract infection: yes Recurrent urinary tract infection: no Sexual activity: practicing safe sex History of sexually transmitted disease: no Treatments attempted: increasing fluids    Past Medical History:  Diagnosis Date   Anxiety    Asthma    BV (bacterial vaginosis)    History of twin pregnancy in prior pregnancy    Premature delivery    UTI (urinary tract infection)     Past Surgical History:  Procedure Laterality Date   CESAREAN SECTION N/A 01/10/2016   Procedure: CESAREAN SECTION;  Surgeon: Mora Bellman, MD;  Location: [place];  Service: Obstetrics;  Laterality: N/A;    Family History  Problem Relation Age of Onset   Hypertension Mother    Sickle cell trait Mother    Diabetes  Mother    Cancer Paternal Uncle    Cancer Paternal Uncle    Cancer Paternal Uncle    Gout Maternal Grandfather    Hypertension Daughter        pulmonary   Bladder Cancer Neg Hx    Kidney cancer Neg Hx     Social History   Socioeconomic History   Marital status: Single    Spouse name: Not on file   Number of children: Not on file   Years of education: Not on file   Highest education level: Not on file  Occupational History   Not on file  Tobacco Use   Smoking status: Former    Packs/day: 0.20    Years: 3.00    Pack years: 0.60    Types: Cigarettes    Quit date: 2020    Years since quitting: 2.9   Smokeless tobacco: Never  Vaping Use   Vaping Use: Never used  Substance and Sexual Activity   Alcohol use: Yes    Alcohol/week: 1.0 standard drink    Types: 1 Standard drinks or equivalent per week   Drug use: Not Currently    Comment: on special occassions   Sexual activity: Yes    Birth control/protection: None  Other Topics Concern   Not on file  Social History Narrative   Not on file   Social Determinants of Health   Financial Resource Strain: Not on file  Food Insecurity: Not  on file  Transportation Needs: Not on file  Physical Activity: Not on file  Stress: Not on file  Social Connections: Not on file  Intimate Partner Violence: Not on file    Outpatient Medications Prior to Visit  Medication Sig Dispense Refill   albuterol (PROVENTIL HFA;VENTOLIN HFA) 108 (90 Base) MCG/ACT inhaler Inhale 2 puffs into the lungs every 6 (six) hours as needed. 1 Inhaler 2   diltiazem (CARDIZEM) 30 MG tablet Take 1 tablet (30 mg total) by mouth as needed (palpitations). 30 tablet 0   EPINEPHrine 0.3 mg/0.3 mL IJ SOAJ injection Inject 0.3 mg into the muscle as needed for anaphylaxis.      escitalopram (LEXAPRO) 10 MG tablet Take 10 mg by mouth daily.     No facility-administered medications prior to visit.    Allergies  Allergen Reactions   Shellfish Allergy Swelling     Review of Systems  Constitutional: Negative.   Respiratory: Negative.    Cardiovascular: Negative.   Gastrointestinal: Negative.   Endocrine: Negative.   Genitourinary:  Positive for dysuria and vaginal discharge. Negative for flank pain, frequency, hematuria and urgency.  Skin: Negative.   Neurological: Negative.       Objective:    Physical Exam Vitals and nursing note reviewed.  Constitutional:      General: She is not in acute distress.    Appearance: Normal appearance.  HENT:     Head: Normocephalic.  Eyes:     Conjunctiva/sclera: Conjunctivae normal.  Cardiovascular:     Rate and Rhythm: Normal rate and regular rhythm.     Pulses: Normal pulses.     Heart sounds: Normal heart sounds.  Pulmonary:     Effort: Pulmonary effort is normal.     Breath sounds: Normal breath sounds.  Abdominal:     Palpations: Abdomen is soft.     Tenderness: There is no abdominal tenderness. There is no right CVA tenderness or left CVA tenderness.  Musculoskeletal:     Cervical back: Normal range of motion.  Skin:    General: Skin is warm.  Neurological:     General: No focal deficit present.     Mental Status: She is alert and oriented to person, place, and time.  Psychiatric:        Mood and Affect: Mood normal.        Behavior: Behavior normal.        Thought Content: Thought content normal.        Judgment: Judgment normal.    BP 110/75    Pulse 91    Temp 98.8 F (37.1 C) (Oral)    Ht 5' 0.98" (1.549 m)    Wt 133 lb 9.6 oz (60.6 kg)    SpO2 98%    BMI 25.26 kg/m  Wt Readings from Last 3 Encounters:  04/13/21 133 lb 9.6 oz (60.6 kg)  02/25/21 133 lb 6 oz (60.5 kg)  01/29/21 134 lb 6 oz (61 kg)    Health Maintenance Due  Topic Date Due   COVID-19 Vaccine (1) Never done    There are no preventive care reminders to display for this patient.   Lab Results  Component Value Date   TSH 0.561 09/07/2020   Lab Results  Component Value Date   WBC 8.4 09/07/2020    HGB 13.6 09/07/2020   HCT 41.7 09/07/2020   MCV 92 09/07/2020   PLT 270 09/07/2020   Lab Results  Component Value Date   NA 140 01/29/2021  K 4.2 01/29/2021   CO2 22 01/29/2021   GLUCOSE 84 01/29/2021   BUN 11 01/29/2021   CREATININE 0.76 01/29/2021   BILITOT 0.4 09/07/2020   ALKPHOS 94 09/07/2020   AST 18 09/07/2020   ALT 9 09/07/2020   PROT 7.4 09/07/2020   ALBUMIN 4.5 09/07/2020   CALCIUM 9.5 01/29/2021   ANIONGAP 17 (H) 08/12/2019   EGFR 113 01/29/2021   Lab Results  Component Value Date   CHOL 180 09/07/2020   Lab Results  Component Value Date   HDL 55 09/07/2020   Lab Results  Component Value Date   LDLCALC 106 (H) 09/07/2020   Lab Results  Component Value Date   TRIG 103 09/07/2020   No results found for: CHOLHDL No results found for: HGBA1C     Assessment & Plan:   Problem List Items Addressed This Visit   None Visit Diagnoses     Urinary frequency    -  Primary   U/A positive for many bacteria, trace lukeocytes, and nitrites. Wet prep positive for BV. Will treat for both BV and UTI.    Relevant Orders   Urinalysis, Routine w reflex microscopic   WET PREP FOR TRICH, YEAST, CLUE   Acute cystitis without hematuria       Start macrobid BID x5 days. urine sent for culture. If having recurrent UTIs, she can reach back out to her uorlogist. Increase fluids. Diflucan x1 sent   Relevant Orders   Urine Culture   BV (bacterial vaginosis)       Continue metronidazole gel for 7 days. She can then use it twice a week for suppression. Follow up in 6 months or sooner with concerns.    Relevant Medications   nitrofurantoin, macrocrystal-monohydrate, (MACROBID) 100 MG capsule   fluconazole (DIFLUCAN) 150 MG tablet        Meds ordered this encounter  Medications   metroNIDAZOLE (METROGEL) 0.75 % vaginal gel    Sig: Place 1 Applicatorful vaginally 2 (two) times a week.    Dispense:  70 g    Refill:  2   nitrofurantoin, macrocrystal-monohydrate,  (MACROBID) 100 MG capsule    Sig: Take 1 capsule (100 mg total) by mouth 2 (two) times daily.    Dispense:  10 capsule    Refill:  0   fluconazole (DIFLUCAN) 150 MG tablet    Sig: Take 1 tablet (150 mg total) by mouth once for 1 dose.    Dispense:  1 tablet    Refill:  0     Charyl Dancer, NP

## 2021-04-13 ENCOUNTER — Other Ambulatory Visit: Payer: Self-pay

## 2021-04-13 ENCOUNTER — Encounter: Payer: Self-pay | Admitting: Nurse Practitioner

## 2021-04-13 ENCOUNTER — Ambulatory Visit (INDEPENDENT_AMBULATORY_CARE_PROVIDER_SITE_OTHER): Payer: Self-pay | Admitting: Nurse Practitioner

## 2021-04-13 VITALS — BP 110/75 | HR 91 | Temp 98.8°F | Ht 60.98 in | Wt 133.6 lb

## 2021-04-13 DIAGNOSIS — N760 Acute vaginitis: Secondary | ICD-10-CM

## 2021-04-13 DIAGNOSIS — N3000 Acute cystitis without hematuria: Secondary | ICD-10-CM

## 2021-04-13 DIAGNOSIS — R350 Frequency of micturition: Secondary | ICD-10-CM

## 2021-04-13 DIAGNOSIS — B9689 Other specified bacterial agents as the cause of diseases classified elsewhere: Secondary | ICD-10-CM

## 2021-04-13 LAB — URINALYSIS, ROUTINE W REFLEX MICROSCOPIC
Bilirubin, UA: NEGATIVE
Glucose, UA: NEGATIVE
Ketones, UA: NEGATIVE
Nitrite, UA: POSITIVE — AB
Protein,UA: NEGATIVE
Specific Gravity, UA: 1.025 (ref 1.005–1.030)
Urobilinogen, Ur: 0.2 mg/dL (ref 0.2–1.0)
pH, UA: 5.5 (ref 5.0–7.5)

## 2021-04-13 LAB — MICROSCOPIC EXAMINATION

## 2021-04-13 LAB — WET PREP FOR TRICH, YEAST, CLUE
Clue Cell Exam: POSITIVE — AB
Trichomonas Exam: NEGATIVE
Yeast Exam: NEGATIVE

## 2021-04-13 MED ORDER — FLUCONAZOLE 150 MG PO TABS: 150.0000 mg | ORAL_TABLET | Freq: Once | ORAL | 0 refills | Status: AC

## 2021-04-13 MED ORDER — METRONIDAZOLE 0.75 % VA GEL: 1.0000 | VAGINAL | 2 refills | Status: DC

## 2021-04-13 MED ORDER — NITROFURANTOIN MONOHYD MACRO 100 MG PO CAPS: 100.0000 mg | ORAL_CAPSULE | Freq: Two times a day (BID) | ORAL | 0 refills | Status: DC

## 2021-04-13 NOTE — Telephone Encounter (Signed)
Appt scheduled

## 2021-04-13 NOTE — Patient Instructions (Addendum)
Start metronidazole vaginal gel nightly for 7 days, then start twice a week to prevent BV infection

## 2021-04-13 NOTE — Telephone Encounter (Signed)
Needs appointment (not today since she was just seen), if she needs to be seen sooner, she can go to urgent care.

## 2021-04-15 NOTE — Progress Notes (Signed)
Acute Office Visit  Subjective:    Patient ID: Allison Sanchez, female    DOB: [birth year], 25 y.o.   MRN: [id number]  Chief Complaint  Patient presents with   Sore Throat    Since tuesday    HPI Patient is in today for swollen tonsils for 2 days. She said that her sore throat started on Friday along with a fever. She said that her symptoms got better on Monday, and then she said she noticed her tonsils were swollen on Tuesday afternoon. She denies any trouble swallowing.  UPPER RESPIRATORY TRACT INFECTION  Worst symptom: swollen tonsils Fever: yes - on Friday Cough: no Shortness of breath: no Wheezing: no Chest pain: no Chest tightness: no Chest congestion: no Nasal congestion: yes Runny nose: no Post nasal drip: yes Sneezing:  occasionally Sore throat: yes Swollen glands: yes Sinus pressure: no Headache: yes - Wednesday Face pain: no Toothache: no Ear pain: no  Ear pressure: no  Eyes red/itching:no Eye drainage/crusting: no  Vomiting: no Rash: no Fatigue: yes Sick contacts: no Strep contacts: no  Context: stable Recurrent sinusitis: no Relief with OTC cold/cough medications: yes  Treatments attempted: tylenol    Past Medical History:  Diagnosis Date   Anxiety    Asthma    BV (bacterial vaginosis)    History of twin pregnancy in prior pregnancy    Premature delivery    UTI (urinary tract infection)     Past Surgical History:  Procedure Laterality Date   CESAREAN SECTION N/A 01/10/2016   Procedure: CESAREAN SECTION;  Surgeon: Mora Bellman, MD;  Location: [place];  Service: Obstetrics;  Laterality: N/A;    Family History  Problem Relation Age of Onset   Hypertension Mother    Sickle cell trait Mother    Diabetes Mother    Cancer Paternal Uncle    Cancer Paternal Uncle    Cancer Paternal Uncle    Gout Maternal Grandfather    Hypertension Daughter        pulmonary   Bladder Cancer Neg Hx    Kidney cancer Neg Hx     Social  History   Socioeconomic History   Marital status: Single    Spouse name: Not on file   Number of children: Not on file   Years of education: Not on file   Highest education level: Not on file  Occupational History   Not on file  Tobacco Use   Smoking status: Former    Packs/day: 0.20    Years: 3.00    Pack years: 0.60    Types: Cigarettes    Quit date: 2020    Years since quitting: 2.9   Smokeless tobacco: Never  Vaping Use   Vaping Use: Never used  Substance and Sexual Activity   Alcohol use: Yes    Alcohol/week: 1.0 standard drink    Types: 1 Standard drinks or equivalent per week   Drug use: Not Currently    Comment: on special occassions   Sexual activity: Yes    Birth control/protection: None  Other Topics Concern   Not on file  Social History Narrative   Not on file   Social Determinants of Health   Financial Resource Strain: Not on file  Food Insecurity: Not on file  Transportation Needs: Not on file  Physical Activity: Not on file  Stress: Not on file  Social Connections: Not on file  Intimate Partner Violence: Not on file    Outpatient Medications Prior to Visit  Medication Sig Dispense Refill   albuterol (PROVENTIL HFA;VENTOLIN HFA) 108 (90 Base) MCG/ACT inhaler Inhale 2 puffs into the lungs every 6 (six) hours as needed. 1 Inhaler 2   diltiazem (CARDIZEM) 30 MG tablet Take 1 tablet (30 mg total) by mouth as needed (palpitations). 30 tablet 0   EPINEPHrine 0.3 mg/0.3 mL IJ SOAJ injection Inject 0.3 mg into the muscle as needed for anaphylaxis.      escitalopram (LEXAPRO) 10 MG tablet Take 10 mg by mouth daily.     metroNIDAZOLE (METROGEL) 0.75 % vaginal gel Place 1 Applicatorful vaginally 2 (two) times a week. 70 g 2   nitrofurantoin, macrocrystal-monohydrate, (MACROBID) 100 MG capsule Take 1 capsule (100 mg total) by mouth 2 (two) times daily. 10 capsule 0   JUNEL FE 1/20 1-20 MG-MCG tablet Take 1 tablet by mouth daily.     No facility-administered  medications prior to visit.    Allergies  Allergen Reactions   Shellfish Allergy Swelling    Review of Systems  Constitutional:  Positive for fatigue and fever.  HENT:  Positive for congestion, postnasal drip and sore throat. Negative for ear pain, rhinorrhea and sinus pressure.   Respiratory: Negative.    Cardiovascular: Negative.   Gastrointestinal: Negative.   Skin: Negative.   Neurological:  Positive for headaches. Negative for dizziness.      Objective:    Physical Exam Vitals and nursing note reviewed.  Constitutional:      General: She is not in acute distress.    Appearance: Normal appearance.  HENT:     Head: Normocephalic.     Right Ear: Tympanic membrane and ear canal normal.     Left Ear: Tympanic membrane and ear canal normal.     Mouth/Throat:     Tonsils: No tonsillar exudate or tonsillar abscesses. 2+ on the right. 2+ on the left.  Eyes:     Conjunctiva/sclera: Conjunctivae normal.  Cardiovascular:     Rate and Rhythm: Normal rate and regular rhythm.     Pulses: Normal pulses.     Heart sounds: Normal heart sounds.  Pulmonary:     Effort: Pulmonary effort is normal.     Breath sounds: Normal breath sounds.  Abdominal:     Palpations: Abdomen is soft.     Tenderness: There is no abdominal tenderness.  Musculoskeletal:     Cervical back: Normal range of motion.  Skin:    General: Skin is warm.  Neurological:     General: No focal deficit present.     Mental Status: She is alert and oriented to person, place, and time.  Psychiatric:        Mood and Affect: Mood normal.        Behavior: Behavior normal.        Thought Content: Thought content normal.        Judgment: Judgment normal.    BP 105/65    Pulse 67    Temp 98.7 F (37.1 C) (Oral)    Ht 5' 0.98" (1.549 m)    Wt 133 lb (60.3 kg)    SpO2 98%    BMI 25.14 kg/m  Wt Readings from Last 3 Encounters:  04/16/21 133 lb (60.3 kg)  04/13/21 133 lb 9.6 oz (60.6 kg)  02/25/21 133 lb 6 oz (60.5  kg)    Health Maintenance Due  Topic Date Due   COVID-19 Vaccine (1) Never done    There are no preventive care reminders to display for this patient.  Lab Results  Component Value Date   TSH 0.561 09/07/2020   Lab Results  Component Value Date   WBC 8.4 09/07/2020   HGB 13.6 09/07/2020   HCT 41.7 09/07/2020   MCV 92 09/07/2020   PLT 270 09/07/2020   Lab Results  Component Value Date   NA 140 01/29/2021   K 4.2 01/29/2021   CO2 22 01/29/2021   GLUCOSE 84 01/29/2021   BUN 11 01/29/2021   CREATININE 0.76 01/29/2021   BILITOT 0.4 09/07/2020   ALKPHOS 94 09/07/2020   AST 18 09/07/2020   ALT 9 09/07/2020   PROT 7.4 09/07/2020   ALBUMIN 4.5 09/07/2020   CALCIUM 9.5 01/29/2021   ANIONGAP 17 (H) 08/12/2019   EGFR 113 01/29/2021   Lab Results  Component Value Date   CHOL 180 09/07/2020   Lab Results  Component Value Date   HDL 55 09/07/2020   Lab Results  Component Value Date   LDLCALC 106 (H) 09/07/2020   Lab Results  Component Value Date   TRIG 103 09/07/2020   No results found for: CHOLHDL No results found for: HGBA1C     Assessment & Plan:   Problem List Items Addressed This Visit   None Visit Diagnoses     Sore throat    -  Primary   No red flags on exam. Flu and strep negative. Covid-19 pending. Tonsils erythematous and swollen. Treat with prednsione. F/U if not improving.    Relevant Orders   Novel Coronavirus, NAA ([facility])   Rapid Strep Screen ([facility] ONLY)   Veritor Flu A/B Waived        Meds ordered this encounter  Medications   predniSONE (DELTASONE) 10 MG tablet    Sig: Take 6 tablets today, then 5 tablets tomorrow, then decrease by 1 tablet every day until gone    Dispense:  21 tablet    Refill:  0     Charyl Dancer, NP

## 2021-04-16 ENCOUNTER — Ambulatory Visit (INDEPENDENT_AMBULATORY_CARE_PROVIDER_SITE_OTHER): Payer: Self-pay | Admitting: Nurse Practitioner

## 2021-04-16 ENCOUNTER — Other Ambulatory Visit: Payer: Self-pay

## 2021-04-16 ENCOUNTER — Encounter: Payer: Self-pay | Admitting: Nurse Practitioner

## 2021-04-16 VITALS — BP 105/65 | HR 67 | Temp 98.7°F | Ht 60.98 in | Wt 133.0 lb

## 2021-04-16 DIAGNOSIS — J029 Acute pharyngitis, unspecified: Secondary | ICD-10-CM

## 2021-04-16 MED ORDER — PREDNISONE 10 MG PO TABS: ORAL_TABLET | ORAL | 0 refills | Status: DC

## 2021-04-16 NOTE — Patient Instructions (Signed)
Start flonase daily Start prednisone taper, take in the morning with food

## 2021-04-17 LAB — SARS-COV-2, NAA 2 DAY TAT

## 2021-04-17 LAB — NOVEL CORONAVIRUS, NAA: SARS-CoV-2, NAA: NOT DETECTED

## 2021-04-20 LAB — CULTURE, GROUP A STREP

## 2021-04-20 LAB — VERITOR FLU A/B WAIVED
Influenza A: NEGATIVE
Influenza B: NEGATIVE

## 2021-04-20 LAB — RAPID STREP SCREEN (MED CTR MEBANE ONLY): Strep Gp A Ag, IA W/Reflex: NEGATIVE

## 2021-04-21 LAB — URINE CULTURE

## 2021-04-23 ENCOUNTER — Ambulatory Visit: Payer: Self-pay

## 2021-04-23 MED ORDER — AMOXICILLIN 500 MG PO CAPS: 500.0000 mg | ORAL_CAPSULE | Freq: Two times a day (BID) | ORAL | 0 refills | Status: DC

## 2021-04-23 NOTE — Addendum Note (Signed)
Addended by: Rodman Pickle A on: 04/23/2021 04:57 PM   Modules accepted: Orders

## 2021-04-23 NOTE — Telephone Encounter (Signed)
Pt stated she was seen by PCP 04/16/21 however, pt is requesting antibiotic stated she still has swelling in tonsils from strep throat.    Chief Complaint: Tonsils are still swollen and red. Symptoms: Same Frequency: Last week Pertinent Negatives: Patient denies fever Disposition: [] ED /[] Urgent Care (no appt availability in office) / [] Appointment(In office/virtual)/ []  [place] [place] [facility]/ [] Home Care/ [] Refused Recommended Disposition /[] [facility] [facility]/ []  Follow-up with PCP Additional Notes: Pt. States she was offered antibiotic last week and declined. Pt. Still not well. Asking for antibiotic to be sent to pharmacy.  Please advise.  Answer Assessment - Initial Assessment Questions 1. ONSET: "When did the throat start hurting?" (Hours or days ago)      04/16/21 2. SEVERITY: "How bad is the sore throat?" (Scale 1-10; mild, moderate or severe)   - MILD (1-3):  doesn't interfere with eating or normal activities   - MODERATE (4-7): interferes with eating some solids and normal activities   - SEVERE (8-10):  excruciating pain, interferes with most normal activities   - SEVERE DYSPHAGIA: can't swallow liquids, drooling     Mild 3. STREP EXPOSURE: "Has there been any exposure to strep within the past week?" If Yes, ask: "What type of contact occurred?"      No 4.  VIRAL SYMPTOMS: "Are there any symptoms of a cold, such as a runny nose, cough, hoarse voice or red eyes?"      Dry cough 5. FEVER: "Do you have a fever?" If Yes, ask: "What is your temperature, how was it measured, and when did it start?"     No 6. PUS ON THE TONSILS: "Is there pus on the tonsils in the back of your throat?"     No 7. OTHER SYMPTOMS: "Do you have any other symptoms?" (e.g., difficulty breathing, headache, rash)     Tonsils are still red and swollen. 8. PREGNANCY: "Is there any chance you are pregnant?" "When was your last menstrual period?"     No  Protocols used: Sore Throat-A-AH

## 2021-04-27 ENCOUNTER — Encounter: Payer: Self-pay | Admitting: Nurse Practitioner

## 2021-04-28 NOTE — Telephone Encounter (Signed)
Please call her and let her know I sent an antibiotic to her pharmacy on 04/23/21. Let me know if I need to re-send it.

## 2021-04-29 ENCOUNTER — Telehealth: Payer: Self-pay | Admitting: Nurse Practitioner

## 2021-04-29 MED ORDER — AMOXICILLIN 500 MG PO CAPS: 500.0000 mg | ORAL_CAPSULE | Freq: Two times a day (BID) | ORAL | 0 refills | Status: DC

## 2021-04-29 NOTE — Telephone Encounter (Signed)
Patient states that she [facility] meds [facility] [address]  and they are not there. Please see previous encounter. Please advise.

## 2021-04-29 NOTE — Telephone Encounter (Signed)
Patient notified via My Chart

## 2021-05-03 ENCOUNTER — Ambulatory Visit: Payer: Self-pay | Admitting: Medical

## 2021-05-25 NOTE — Progress Notes (Deleted)
Cardiology Office Note:    Date:  05/25/2021   ID:  Allison Sanchez, DOB [birth year], MRN [id number]  PCP:  Marjie Skiff, NP  [facility] Cardiologist:  Yvonne Kendall, MD  [facility] Electrophysiologist:  None   Referring MD: Marjie Skiff, NP   Chief Complaint: 3 month follow-up  History of Present Illness:    Allison Sanchez is a 26 y.o. female with a hx of with a hx of palpitations, asthma, anxiety who is being seen for follow-up.    The patient was seen 12/02/2020 for palpitations with symptoms felt to be consistent with PACs PVCs.  She was unremarkable.  A heart monitor was ordered.  Heart monitor showed predominantly sinus rhythm with an average rate of 82 bpm, rare PACs and PVCs, no prolonged pauses, patient triggered events corresponded to sinus rhythm with PACs and PVCs.  Last seen 01/29/21 and reported persistent palpitations. Labs checked. She was given dilt 30mg  PRN palpitations.   Today,   Past Medical History:  Diagnosis Date   Anxiety    Asthma    BV (bacterial vaginosis)    History of twin pregnancy in prior pregnancy    Premature delivery    UTI (urinary tract infection)     Past Surgical History:  Procedure Laterality Date   CESAREAN SECTION N/A 01/10/2016   Procedure: CESAREAN SECTION;  Surgeon: 01/12/2016, MD;  Location: [facility];  Service: Obstetrics;  Laterality: N/A;    Current Medications: No outpatient medications have been marked as taking for the 05/26/21 encounter (Appointment) with 07/24/21, Willim Turnage H, PA-C.     Allergies:   Shellfish allergy   Social History   Socioeconomic History   Marital status: Single    Spouse name: Not on file   Number of children: Not on file   Years of education: Not on file   Highest education level: Not on file  Occupational History   Not on file  Tobacco Use   Smoking status: Former    Packs/day: 0.20    Years: 3.00    Pack years: 0.60    Types: Cigarettes    Quit date: 2020     Years since quitting: 3.0   Smokeless tobacco: Never  Vaping Use   Vaping Use: Never used  Substance and Sexual Activity   Alcohol use: Yes    Alcohol/week: 1.0 standard drink    Types: 1 Standard drinks or equivalent per week   Drug use: Not Currently    Comment: on special occassions   Sexual activity: Yes    Birth control/protection: None  Other Topics Concern   Not on file  Social History Narrative   Not on file   Social Determinants of Health   Financial Resource Strain: Not on file  Food Insecurity: Not on file  Transportation Needs: Not on file  Physical Activity: Not on file  Stress: Not on file  Social Connections: Not on file     Family History: The patient's family history includes Cancer in her paternal uncle, paternal uncle, and paternal uncle; Diabetes in her mother; Gout in her maternal grandfather; Hypertension in her daughter and mother; Sickle cell trait in her mother. There is no history of Bladder Cancer or Kidney cancer.  ROS:   Please see the history of present illness.     All other systems reviewed and are negative.  EKGs/Labs/Other Studies Reviewed:    The following studies were reviewed today:  Heart monitor 12/12/2020 The patient was  monitored for 13 days, 21 hours. The predominant rhythm was sinus with an average rate of 82 bpm (range 54-164 bpm). There were rare PACs and PVCs. No sustained arrhythmia or prolonged pause was observed. Patient triggered events correspond to sinus rhythm, sinus rhythm with PACs, and sinus rhythm with PVCs.   Predominantly sinus rhythm with rare PACs and PVCs.  EKG:  EKG is *** ordered today.  The ekg ordered today demonstrates ***  Recent Labs: 09/07/2020: ALT 9; Hemoglobin 13.6; Platelets 270; TSH 0.561 01/29/2021: BUN 11; Creatinine, Ser 0.76; Magnesium 2.0; Potassium 4.2; Sodium 140  Recent Lipid Panel    Component Value Date/Time   CHOL 180 09/07/2020 1516   TRIG 103 09/07/2020 1516   HDL 55  09/07/2020 1516   LDLCALC 106 (H) 09/07/2020 1516     Risk Assessment/Calculations:   {Does this patient have ATRIAL FIBRILLATION?:[id number]}   Physical Exam:    VS:  There were no vitals taken for this visit.    Wt Readings from Last 3 Encounters:  04/16/21 133 lb (60.3 kg)  04/13/21 133 lb 9.6 oz (60.6 kg)  02/25/21 133 lb 6 oz (60.5 kg)     GEN: *** Well nourished, well developed in no acute distress HEENT: Normal NECK: No JVD; No carotid bruits LYMPHATICS: No lymphadenopathy CARDIAC: ***RRR, no murmurs, rubs, gallops RESPIRATORY:  Clear to auscultation without rales, wheezing or rhonchi  ABDOMEN: Soft, non-tender, non-distended MUSCULOSKELETAL:  No edema; No deformity  SKIN: Warm and dry NEUROLOGIC:  Alert and oriented x 3 PSYCHIATRIC:  Normal affect   ASSESSMENT:    No diagnosis found. PLAN:    In order of problems listed above:  Palpitations Rare PVCs  Disposition: Follow up {follow up:15908} with ***   Shared Decision Making/Informed Consent   {Are you ordering a CV Procedure (e.g. stress test, cath, DCCV, TEE, etc)?   Press F2        :[id number]}    Signed, Travarius Lange David Stall, PA-C  05/25/2021 9:57 AM    [facility]

## 2021-05-26 ENCOUNTER — Ambulatory Visit: Payer: Self-pay | Admitting: Medical

## 2021-06-01 ENCOUNTER — Other Ambulatory Visit: Payer: Self-pay | Admitting: Nurse Practitioner

## 2021-06-01 NOTE — Telephone Encounter (Signed)
Requested medication (s) are due for refill today: Yes  Requested medication (s) are on the active medication list: Yes  Last refill:  01/29/21  Future visit scheduled: Yes  Notes to clinic:  Historical provider    Requested Prescriptions  Pending Prescriptions Disp Refills   escitalopram (LEXAPRO) 10 MG tablet      Sig: Take 1 tablet (10 mg total) by mouth daily.     Psychiatry:  Antidepressants - SSRI Passed - 06/01/2021 12:40 PM      Passed - Valid encounter within last 6 months    Recent Outpatient Visits           1 month ago Sore throat   [place], NP   1 month ago Urinary frequency   [place], Lauren A, NP   3 months ago Possible exposure to STD   [facility], Lauren A, NP   4 months ago Pityriasis rosea-like skin eruption   [place], Lauren A, NP   8 months ago Palpitations   [place], Barbaraann Faster, NP       Future Appointments             In 2 weeks [place], [employer], PA-C [place]   In 3 months [place], Barbaraann Faster, NP [employer]

## 2021-06-01 NOTE — Telephone Encounter (Signed)
Medication Refill - Medication: escitalopram (LEXAPRO) 10 MG tablet  Has the patient contacted their pharmacy? Yes.    (Agent: If yes, when and what did the pharmacy advise?)  Preferred Pharmacy (with phone number or street name):  [id number] [place], [place] - [address] Phone:  [phone]  Fax:  [phone]      Has the patient been seen for an appointment in the last year OR does the patient have an upcoming appointment? Yes.    Agent: Please be advised that RX refills may take up to 3 business days. We ask that you follow-up with your pharmacy.

## 2021-06-10 ENCOUNTER — Encounter: Payer: Self-pay | Admitting: Nurse Practitioner

## 2021-06-10 ENCOUNTER — Ambulatory Visit (INDEPENDENT_AMBULATORY_CARE_PROVIDER_SITE_OTHER): Payer: Self-pay | Admitting: Nurse Practitioner

## 2021-06-10 ENCOUNTER — Other Ambulatory Visit: Payer: Self-pay

## 2021-06-10 VITALS — BP 114/82 | HR 101 | Temp 98.4°F | Ht 60.98 in | Wt 131.8 lb

## 2021-06-10 DIAGNOSIS — J029 Acute pharyngitis, unspecified: Secondary | ICD-10-CM

## 2021-06-10 MED ORDER — LIDOCAINE VISCOUS HCL 2 % MT SOLN: 15.0000 mL | OROMUCOSAL | 0 refills | Status: DC | PRN

## 2021-06-10 MED ORDER — AMOXICILLIN-POT CLAVULANATE 875-125 MG PO TABS: 1.0000 | ORAL_TABLET | Freq: Two times a day (BID) | ORAL | 0 refills | Status: AC

## 2021-06-10 NOTE — Assessment & Plan Note (Signed)
Acute and recurrence.  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  Will place referral to [facility] as is self pay and they may offer assistance via payment programs.  Swab today with negative swab, will send for culture and check Covid too.  Augmentin sent in as appears strep on exam and Viscous Lidocaine.  Recommend taking Zyrtec daily.  Return as needed for worsening or ongoing.

## 2021-06-10 NOTE — Progress Notes (Signed)
BP 114/82    Pulse (!) 101    Temp 98.4 F (36.9 C) (Oral)    Ht 5' 0.98" (1.549 m)    Wt 131 lb 12.8 oz (59.8 kg)    BMI 24.92 kg/m    Subjective:    Patient ID: Allison Sanchez, female    DOB: 1995-05-11, 26 y.o.   MRN: [id number]  HPI: Allison Sanchez is a 26 y.o. female  Chief Complaint  Patient presents with   Sore Throat    Patient states the sore throat typically hurts worse at night.    Swollen Tonsils    Patient thinks it is about time for her tonsils to come out. Patient states since last year she has been having recurrent strep throat infections. Patient states she recently had strep throat back in January. Patient states she is starting to have discomfort in the right side of her throat. Patient states she does have a history of an abscess in her throat. Patient states last week she was coughing up thick yellow and brown phlegm. Patient denies having any fever.    UPPER RESPIRATORY TRACT INFECTION Present for one week.  Gets strep throat often, last treated end of December.  Culture at that time noted Strep B.  Treated with Amoxicillin and Prednisone.  Does have underlying pollen allergies, takes Zyrtec as needed.  Has seen ENT in past who recommended surgery, but she is self pay. Worst symptom: sore throat Fever: no Cough: yes Shortness of breath: no Wheezing: no Chest pain: no Chest tightness: no Chest congestion: no Nasal congestion: no Runny nose: no Post nasal drip: no Sneezing: no Sore throat: yes Swollen glands: yes Sinus pressure: no Headache: no Face pain: no Toothache: no Ear pain: none Ear pressure: none Eyes red/itching: a little Eye drainage/crusting: no  Vomiting: no Rash: no Fatigue: yes Sick contacts: no Strep contacts: no  Context: stable Recurrent sinusitis: no Relief with OTC cold/cough medications: yes  Treatments attempted: Tylenol and cough drops    Relevant past medical, surgical, family and social history reviewed and updated  as indicated. Interim medical history since our last visit reviewed. Allergies and medications reviewed and updated.  Review of Systems  Constitutional:  Positive for fatigue. Negative for activity change, appetite change, chills and fever.  HENT:  Positive for sore throat. Negative for congestion, ear discharge, ear pain, facial swelling, postnasal drip, rhinorrhea, sinus pressure, sinus pain, sneezing and voice change.   Respiratory:  Positive for cough. Negative for chest tightness, shortness of breath and wheezing.   Cardiovascular:  Negative for chest pain, palpitations and leg swelling.  Gastrointestinal: Negative.   Endocrine: Negative.   Neurological: Negative.   Psychiatric/Behavioral: Negative.     Per HPI unless specifically indicated above     Objective:    BP 114/82    Pulse (!) 101    Temp 98.4 F (36.9 C) (Oral)    Ht 5' 0.98" (1.549 m)    Wt 131 lb 12.8 oz (59.8 kg)    BMI 24.92 kg/m   Wt Readings from Last 3 Encounters:  06/10/21 131 lb 12.8 oz (59.8 kg)  04/16/21 133 lb (60.3 kg)  04/13/21 133 lb 9.6 oz (60.6 kg)    Physical Exam Vitals and nursing note reviewed.  Constitutional:      General: She is awake. She is not in acute distress.    Appearance: She is well-developed and well-groomed. She is not ill-appearing or toxic-appearing.  HENT:  Head: Normocephalic.     Right Ear: Hearing, ear canal and external ear normal. A middle ear effusion is present.     Left Ear: Hearing, ear canal and external ear normal. A middle ear effusion is present.     Nose: Nose normal.     Right Sinus: No maxillary sinus tenderness or frontal sinus tenderness.     Left Sinus: No maxillary sinus tenderness or frontal sinus tenderness.     Mouth/Throat:     Mouth: Mucous membranes are moist.     Pharynx: Pharyngeal swelling, oropharyngeal exudate and posterior oropharyngeal erythema present.     Tonsils: 2+ on the right. 2+ on the left.  Eyes:     General: Lids are normal.         Right eye: No discharge.        Left eye: No discharge.     Conjunctiva/sclera: Conjunctivae normal.     Pupils: Pupils are equal, round, and reactive to light.  Neck:     Thyroid: No thyromegaly.     Vascular: No carotid bruit.  Cardiovascular:     Rate and Rhythm: Normal rate and regular rhythm.     Heart sounds: Normal heart sounds. No murmur heard.   No gallop.  Pulmonary:     Effort: Pulmonary effort is normal. No accessory muscle usage or respiratory distress.     Breath sounds: Normal breath sounds.  Abdominal:     General: Bowel sounds are normal.     Palpations: Abdomen is soft. There is no hepatomegaly or splenomegaly.  Musculoskeletal:     Cervical back: Normal range of motion and neck supple.     Right lower leg: No edema.     Left lower leg: No edema.  Lymphadenopathy:     Head:     Right side of head: Submental adenopathy present. No submandibular, tonsillar, preauricular or posterior auricular adenopathy.     Left side of head: Submental adenopathy present. No submandibular, tonsillar, preauricular or posterior auricular adenopathy.     Cervical: No cervical adenopathy.  Skin:    General: Skin is warm and dry.  Neurological:     Mental Status: She is alert and oriented to person, place, and time.  Psychiatric:        Attention and Perception: Attention normal.        Mood and Affect: Mood normal.        Behavior: Behavior normal. Behavior is cooperative.        Thought Content: Thought content normal.        Judgment: Judgment normal.   Results for orders placed or performed in visit on 04/16/21  Novel Coronavirus, NAA ([facility])   Specimen: Nasopharyngeal(NP) swabs in vial transport medium  Result Value Ref Range   SARS-CoV-2, NAA Not Detected Not Detected  Rapid Strep Screen ([facility] ONLY)   Specimen: Other   Other  Result Value Ref Range   Strep Gp A Ag, IA W/Reflex Negative Negative  Culture, Group A Strep   Other  Result Value Ref  Range   Strep A Culture Comment (A)   SARS-COV-2, NAA 2 DAY TAT  Result Value Ref Range   SARS-CoV-2, NAA 2 DAY TAT Performed   Veritor Flu A/B Waived  Result Value Ref Range   Influenza A Negative Negative   Influenza B Negative Negative      Assessment & Plan:   Problem List Items Addressed This Visit       Other  Sore throat - Primary    Acute and recurrence.  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  Will place referral to [facility] as is self pay and they may offer assistance via payment programs.  Swab today with negative swab, will send for culture and check Covid too.  Augmentin sent in as appears strep on exam and Viscous Lidocaine.  Recommend taking Zyrtec daily.  Return as needed for worsening or ongoing.      Relevant Orders   Ambulatory referral to ENT   Rapid Strep Screen ([facility] ONLY)   Novel Coronavirus, NAA ([facility])     Follow up plan: Return if symptoms worsen or fail to improve.

## 2021-06-10 NOTE — Patient Instructions (Signed)
Strep Throat, Adult ?Strep throat is an infection of the throat. It is caused by germs (bacteria). Strep throat is common during the cold months of the year. It mostly affects children who are 5-24 years old. However, people of all ages can get it at any time of the year. This infection spreads from person to person through coughing, sneezing, or having close contact. ?What are the causes? ?This condition is caused by the Streptococcus pyogenes germ. ?What increases the risk? ?You care for young children. Children are more likely to get strep throat and may spread it to others. ?You go to crowded places. Germs can spread easily in such places. ?You kiss or touch someone who has strep throat. ?What are the signs or symptoms? ?Fever or chills. ?Redness, swelling, or pain in the tonsils or throat. ?Pain or trouble when swallowing. ?White or yellow spots on the tonsils or throat. ?Tender glands in the neck and under the jaw. ?Bad breath. ?Red rash all over the body. This is rare. ?How is this treated? ?Medicines that kill germs (antibiotics). ?Medicines that treat pain or fever. These include: ?Ibuprofen or acetaminophen. ?Aspirin, only for people who are over the age of 18. ?Cough drops. ?Throat sprays. ?Follow these instructions at home: ?Medicines ? ?Take over-the-counter and prescription medicines only as told by your doctor. ?Take your antibiotic medicine as told by your doctor. Do not stop taking the antibiotic even if you start to feel better. ?Eating and drinking ? ?If you have trouble swallowing, eat soft foods until your throat feels better. ?Drink enough fluid to keep your pee (urine) pale yellow. ?To help with pain, you may have: ?Warm fluids, such as soup and tea. ?Cold fluids, such as frozen desserts or popsicles. ?General instructions ?Rinse your mouth (gargle) with a salt-water mixture 3-4 times a day or as needed. To make a salt-water mixture, dissolve ?-1 tsp (3-6 g) of salt in 1 cup (237 mL) of warm  water. ?Rest as much as you can. ?Stay home from work or school until you have been taking antibiotics for 24 hours. ?Do not smoke or use any products that contain nicotine or tobacco. If you need help quitting, ask your doctor. ?Keep all follow-up visits. ?How is this prevented? ? ?Do not share food, drinking cups, or personal items. They can cause the germs to spread. ?Wash your hands well with soap and water. Make sure that all people in your house wash their hands well. ?Have family members tested if they have a fever or a sore throat. They may need an antibiotic if they have strep throat. ?Contact a doctor if: ?You have swelling in your neck that keeps getting bigger. ?You get a rash, cough, or earache. ?You cough up a thick fluid that is green, yellow-brown, or bloody. ?You have pain that does not get better with medicine. ?Your symptoms get worse instead of getting better. ?You have a fever. ?Get help right away if: ?You vomit. ?You have a very bad headache. ?Your neck hurts or feels stiff. ?You have chest pain or are short of breath. ?You have drooling, very bad throat pain, or changes in your voice. ?Your neck is swollen, or the skin gets red and tender. ?Your mouth is dry, or you are peeing less than normal. ?You keep feeling more tired or have trouble waking up. ?Your joints are red or painful. ?These symptoms may be an emergency. Do not wait to see if the symptoms will go away. Get help right away. Call   your local emergency services (911 in the [place] ?Summary ?Strep throat is an infection of the throat. It is caused by germs (bacteria). ?This infection can spread from person to person through coughing, sneezing, or having close contact. ?Take your medicines, including antibiotics, as told by your doctor. Do not stop taking the antibiotic even if you start to feel better. ?To prevent the spread of germs, wash your hands well with soap and water. Have others do the same. Do not share food, drinking cups,  or personal items. ?Get help right away if you have a bad headache, chest pain, shortness of breath, a stiff or painful neck, or you vomit. ?This information is not intended to replace advice given to you by your health care provider. Make sure you discuss any questions you have with your health care provider. ?Document Revised: 08/04/2020 Document Reviewed: 08/04/2020 ?Elsevier Patient Education ? 2022 Elsevier Inc[employer] ? ?

## 2021-06-12 LAB — NOVEL CORONAVIRUS, NAA: SARS-CoV-2, NAA: NOT DETECTED

## 2021-06-12 NOTE — Progress Notes (Signed)
Contacted via MyChart   Covid testing is negative!!

## 2021-06-14 ENCOUNTER — Other Ambulatory Visit: Payer: Self-pay | Admitting: Nurse Practitioner

## 2021-06-14 ENCOUNTER — Encounter: Payer: Self-pay | Admitting: Nurse Practitioner

## 2021-06-14 LAB — CULTURE, GROUP A STREP

## 2021-06-14 LAB — RAPID STREP SCREEN (MED CTR MEBANE ONLY): Strep Gp A Ag, IA W/Reflex: NEGATIVE

## 2021-06-14 MED ORDER — ESCITALOPRAM OXALATE 10 MG PO TABS: 10.0000 mg | ORAL_TABLET | Freq: Every day | ORAL | 4 refills | Status: DC

## 2021-06-14 NOTE — Progress Notes (Signed)
Contacted via [facility]   As we discussed at visit, I do believe you are colonized with strep, sample has returned positive for strep, but not the Strep A that we see with strep throat.  However, ENT will  be valuable since you are having these recurrent symptoms.  Any questions?

## 2021-06-16 NOTE — Progress Notes (Signed)
Cardiology Office Note:    Date:  06/17/2021   ID:  Allison Sanchez, DOB 1995-11-29, MRN [id number]  PCP:  Marjie Skiff, NP  [facility] Cardiologist:  Yvonne Kendall, MD  [facility] Electrophysiologist:  None   Referring MD: Marjie Skiff, NP   Chief Complaint: 3 month follow-up  History of Present Illness:    Allison Sanchez is a 26 y.o. female with a hx of palpitations, asthma, anxiety who is being seen for follow-up.    The patient was seen 12/02/2020 for palpitations with symptoms felt to be consistent with PACs PVCs.  She was unremarkable.  A heart monitor was ordered.  Heart monitor showed predominantly sinus rhythm with an average rate of 82 bpm, rare PACs and PVCs, no prolonged pauses, patient triggered events corresponded to sinus rhythm with PACs and PVCs.  Last seen 01/29/21 and reported persistent palpitations. Labs were drawn. She was given short-acting diltiazem to use as needed.   Today, the patient reports improved palpitations, says her stress has improved. She is on anxiety medications, which helps a lot. She has occasional palpitations. Overall trying to control stress/frustration. She has not needed diltiazem. No chest pain, SOB, LLE, orthopnea, pnd.   Past Medical History:  Diagnosis Date   Anxiety    Asthma    BV (bacterial vaginosis)    History of twin pregnancy in prior pregnancy    Premature delivery    UTI (urinary tract infection)     Past Surgical History:  Procedure Laterality Date   CESAREAN SECTION N/A 01/10/2016   Procedure: CESAREAN SECTION;  Surgeon: Catalina Antigua, MD;  Location: [facility];  Service: Obstetrics;  Laterality: N/A;    Current Medications: Current Meds  Medication Sig   albuterol (PROVENTIL HFA;VENTOLIN HFA) 108 (90 Base) MCG/ACT inhaler Inhale 2 puffs into the lungs every 6 (six) hours as needed.   EPINEPHrine 0.3 mg/0.3 mL IJ SOAJ injection Inject 0.3 mg into the muscle as needed for anaphylaxis.     escitalopram (LEXAPRO) 10 MG tablet Take 1 tablet (10 mg total) by mouth daily.   JUNEL FE 1/20 1-20 MG-MCG tablet Take 1 tablet by mouth daily.     Allergies:   Shellfish allergy   Social History   Socioeconomic History   Marital status: Single    Spouse name: Not on file   Number of children: Not on file   Years of education: Not on file   Highest education level: Not on file  Occupational History   Not on file  Tobacco Use   Smoking status: Former    Packs/day: 0.20    Years: 3.00    Pack years: 0.60    Types: Cigarettes    Quit date: 2020    Years since quitting: 3.1   Smokeless tobacco: Never  Vaping Use   Vaping Use: Every day  Substance and Sexual Activity   Alcohol use: Yes    Alcohol/week: 1.0 standard drink    Types: 1 Standard drinks or equivalent per week   Drug use: Not Currently    Comment: on special occassions   Sexual activity: Yes    Birth control/protection: None  Other Topics Concern   Not on file  Social History Narrative   Not on file   Social Determinants of Health   Financial Resource Strain: Not on file  Food Insecurity: Not on file  Transportation Needs: Not on file  Physical Activity: Not on file  Stress: Not on file  Social Connections: Not on file     Family History: The patient's family history includes Cancer in her paternal uncle, paternal uncle, and paternal uncle; Diabetes in her mother; Gout in her maternal grandfather; Hypertension in her daughter and mother; Sickle cell trait in her mother. There is no history of Bladder Cancer or Kidney cancer.  ROS:   Please see the history of present illness.     All other systems reviewed and are negative.  EKGs/Labs/Other Studies Reviewed:    The following studies were reviewed today:    Heart monitor 12/12/2020 The patient was monitored for 13 days, 21 hours. The predominant rhythm was sinus with an average rate of 82 bpm (range 54-164 bpm). There were rare PACs and PVCs. No  sustained arrhythmia or prolonged pause was observed. Patient triggered events correspond to sinus rhythm, sinus rhythm with PACs, and sinus rhythm with PVCs.   Predominantly sinus rhythm with rare PACs and PVCs.    EKG:  EKG is  ordered today.  The ekg ordered today demonstrates NSR, 78bpm, PAC, PRI , Qtc  Recent Labs: 09/07/2020: ALT 9; Hemoglobin 13.6; Platelets 270; TSH 0.561 01/29/2021: BUN 11; Creatinine, Ser 0.76; Magnesium 2.0; Potassium 4.2; Sodium 140  Recent Lipid Panel    Component Value Date/Time   CHOL 180 09/07/2020 1516   TRIG 103 09/07/2020 1516   HDL 55 09/07/2020 1516   LDLCALC 106 (H) 09/07/2020 1516     Physical Exam:    VS:  BP 100/60 (BP Location: Left Arm, Patient Position: Sitting, Cuff Size: Normal)    Pulse 78    Ht 5\' 1"  (1.549 m)    Wt 132 lb 6 oz (60 kg)    BMI 25.01 kg/m     Wt Readings from Last 3 Encounters:  06/17/21 132 lb 6 oz (60 kg)  06/10/21 131 lb 12.8 oz (59.8 kg)  04/16/21 133 lb (60.3 kg)     GEN:  Well nourished, well developed in no acute distress HEENT: Normal NECK: No JVD; No carotid bruits LYMPHATICS: No lymphadenopathy CARDIAC: RRR, no murmurs, rubs, gallops RESPIRATORY:  Clear to auscultation without rales, wheezing or rhonchi  ABDOMEN: Soft, non-tender, non-distended MUSCULOSKELETAL:  No edema; No deformity  SKIN: Warm and dry NEUROLOGIC:  Alert and oriented x 3 PSYCHIATRIC:  Normal affect   ASSESSMENT:    1. Palpitations   2. PVC (premature ventricular contraction)    PLAN:    In order of problems listed above:  Palpitations PVCs Patient reports improved palpitations since stress is better controlled. She is on anti-anxiety medications, which is helping as well as some lifestyle changes. She still has occasional palpitations, very brief episodes. She has not needed. Short-acting diltiazem. No further work-up at this time, can follow-up PRN.   Disposition: Follow up prn with MD/APP      Signed, Uyen Eichholz 04/18/21, PA-C  06/17/2021 9:25 AM    [facility]

## 2021-06-17 ENCOUNTER — Encounter: Payer: Self-pay | Admitting: Medical

## 2021-06-17 ENCOUNTER — Ambulatory Visit (INDEPENDENT_AMBULATORY_CARE_PROVIDER_SITE_OTHER): Payer: Self-pay | Admitting: Medical

## 2021-06-17 ENCOUNTER — Other Ambulatory Visit: Payer: Self-pay

## 2021-06-17 VITALS — BP 100/60 | HR 78 | Ht 61.0 in | Wt 132.4 lb

## 2021-06-17 DIAGNOSIS — R002 Palpitations: Secondary | ICD-10-CM

## 2021-06-17 DIAGNOSIS — I493 Ventricular premature depolarization: Secondary | ICD-10-CM

## 2021-06-17 NOTE — Patient Instructions (Signed)
Medication Instructions:  ?No changes at this time. ? ?*If you need a refill on your cardiac medications before your next appointment, please call your pharmacy* ? ? ?Lab Work: ?None ? ?If you have labs (blood work) drawn today and your tests are completely normal, you will receive your results only by: ?MyChart Message (if you have MyChart) OR ?A paper copy in the mail ?If you have any lab test that is abnormal or we need to change your treatment, we will call you to review the results. ? ? ?Testing/Procedures: ?None ? ? ?Follow-Up: ?At [facility], you and your health needs are our priority.  As part of our continuing mission to provide you with exceptional heart care, we have created designated Provider Care Teams.  These Care Teams include your primary Cardiologist (physician) and Advanced Practice Providers (APPs -  Physician Assistants and Nurse Practitioners) who all work together to provide you with the care you need, when you need it. ? ? ?Your next appointment:   ?As needed ?

## 2021-06-18 ENCOUNTER — Encounter: Payer: Self-pay | Admitting: Nurse Practitioner

## 2021-06-18 MED ORDER — FLUCONAZOLE 150 MG PO TABS: 150.0000 mg | ORAL_TABLET | Freq: Once | ORAL | 0 refills | Status: AC

## 2021-09-02 ENCOUNTER — Ambulatory Visit (INDEPENDENT_AMBULATORY_CARE_PROVIDER_SITE_OTHER): Payer: Self-pay | Admitting: Nurse Practitioner

## 2021-09-02 ENCOUNTER — Encounter: Payer: Self-pay | Admitting: Nurse Practitioner

## 2021-09-02 VITALS — BP 95/65 | HR 65 | Temp 98.2°F | Wt 131.2 lb

## 2021-09-02 DIAGNOSIS — E7800 Pure hypercholesterolemia, unspecified: Secondary | ICD-10-CM | POA: Insufficient documentation

## 2021-09-02 DIAGNOSIS — F419 Anxiety disorder, unspecified: Secondary | ICD-10-CM

## 2021-09-02 DIAGNOSIS — Z0000 Encounter for general adult medical examination without abnormal findings: Secondary | ICD-10-CM

## 2021-09-02 DIAGNOSIS — R42 Dizziness and giddiness: Secondary | ICD-10-CM | POA: Insufficient documentation

## 2021-09-02 DIAGNOSIS — D508 Other iron deficiency anemias: Secondary | ICD-10-CM

## 2021-09-02 NOTE — Patient Instructions (Signed)
Start taking Slow Fe or Vitron for iron replacement. ? ?Dizziness ?Dizziness is a common problem. It makes you feel unsteady or light-headed. You may feel like you are about to pass out (faint). Dizziness can lead to getting hurt if you stumble or fall. Dizziness can be caused by many things, including: ?Medicines. ?Not having enough water in your body (dehydration). ?Illness. ?Follow these instructions at home: ?Eating and drinking ? ?Drink enough fluid to keep your pee (urine) pale yellow. This helps to keep you from getting dehydrated. Try to drink more clear fluids, such as water. ?Do not drink alcohol. ?Limit how much caffeine you drink or eat, if your doctor tells you to do that. ?Limit how much salt (sodium) you drink or eat, if your doctor tells you to do that. ?Activity ?pibName ?

## 2021-09-02 NOTE — Assessment & Plan Note (Signed)
Acute episode x 3 lasting for seconds.  ?related to poor fluid intake.  Orthostatic BPs normal in office today.  No red flags on exam.  Recommend increase fluid intake and ensure to get some snacks in between meals.  Check labs outpatient to include iron level and TSH + CMP. ?

## 2021-09-02 NOTE — Progress Notes (Signed)
? ?BP 95/65   Pulse 65   Temp 98.2 ?F (36.8 ?C) (Oral)   Wt 131 lb 3.2 oz (59.5 kg)   SpO2 99%   BMI 24.79 kg/m?   ? ?Subjective:  ? ? Patient ID: Allison Sanchez, female    DOB: 1995/08/13, 26 y.o.   MRN: [id number] ? ?HPI: ?ERIC MORGANTI is a 26 y.o. female presenting on 09/02/2021 for comprehensive medical examination. Current medical complaints include: dizziness and mood ? ?She currently lives with: significant other ?Menopausal Symptoms: no ? ?DIZZINESS ?She started noticing this on Friday and Saturday had no episodes -- then Sunday it returned.  Notices it most when standing, will feel off balance.  Has had episodes before, years ago. ? ?She is concerned for anemia -- is always cold and has darkness under eyes. ?Duration: days ?Description of symptoms: lightheaded ?Duration of episode: seconds ?Dizziness frequency: recurrent ?Provoking factors:  standing  ?Aggravating factors:   standing ?Triggered by rolling over in bed: no ?Triggered by bending over: yes ?Aggravated by head movement: yes ?Aggravated by exertion, coughing, loud noises: no ?Recent head injury: no ?Recent or current viral symptoms: no ?History of vasovagal episodes: no ?Nausea: no ?Vomiting: no ?Tinnitus: no ?Hearing loss: no ?Aural fullness: no ?Headache: had one with initial episode on Friday before dizziness ?Photophobia/phonophobia: no ?Unsteady gait: no ?Postural instability: no ?Diplopia, dysarthria, dysphagia or weakness: no ?Related to exertion: no ?Pallor: no ?Diaphoresis: no ?Dyspnea: no ?Chest pain: no  ? ?DEPRESSION ?Has Lexapro, but only takes this as needed.  Feels that depression is worse due to things going on at home over past 2-3 weeks. ?Mood status: uncontrolled ?Satisfied with current treatment?: yes ?Symptom severity: moderate  ?Duration of current treatment : chronic ?Side effects: no ?Medication compliance: poor compliance ?Psychotherapy/counseling: none ?Previous psychiatric medications: Lexapro ?Depressed mood:  yes ?Anxious mood: no ?Anhedonia: no ?Significant weight loss or gain: no ?Insomnia: is sleeping more often ?Fatigue: yes ?Feelings of worthlessness or guilt: no ?Impaired concentration/indecisiveness: no ?Suicidal ideations: no ?Hopelessness: yes ?Crying spells: yes ? ?  09/02/2021  ?  9:16 AM 04/13/2021  ?  9:05 AM 02/25/2021  ?  3:44 PM 04/06/2020  ? 11:23 AM 09/04/2019  ?  2:13 PM  ?Depression screen PHQ 2/9  ?Decreased Interest 3 2 1 1  0  ?Down, Depressed, Hopeless 2 1 1 1  0  ?PHQ - 2 Score 5 3 2 2  0  ?Altered sleeping 2 1 0 1   ?Tired, decreased energy 3 2 2 1    ?Change in appetite 1 1 1 1    ?Feeling bad or failure about yourself  3 2 1 1    ?Trouble concentrating 1 0 0 1   ?Moving slowly or fidgety/restless 0 0 0 0   ?Suicidal thoughts 0 0 0 0   ?PHQ-9 Score 15 9 6 7    ?Difficult doing work/chores  Very difficult  Somewhat difficult   ?  ? ?  09/02/2021  ?  9:17 AM 04/13/2021  ?  9:06 AM 02/25/2021  ?  3:45 PM 09/07/2020  ?  2:27 PM  ?GAD 7 : Generalized Anxiety Score  ?Nervous, Anxious, on Edge 1 1 1 1   ?Control/stop worrying 1 2 1 1   ?Worry too much - different things 2 3 1 1   ?Trouble relaxing 0 0 0 1  ?Restless 0 0 0 0  ?Easily annoyed or irritable 3 3 2 1   ?Afraid - awful might happen 3 2 0 2  ?Total GAD 7 Score 10  11 5 7   ?Anxiety Difficulty Somewhat difficult Very difficult Somewhat difficult Not difficult at all  ? ? ?The patient does not have a history of falls. I did not complete a risk assessment for falls. A plan of care for falls was not documented. ? ? ?Past Medical History:  ?Past Medical History:  ?Diagnosis Date  ? Anxiety   ? Asthma   ? BV (bacterial vaginosis)   ? History of twin pregnancy in prior pregnancy   ? Premature delivery   ? UTI (urinary tract infection)   ? ? ?Surgical History:  ?Past Surgical History:  ?Procedure Laterality Date  ? CESAREAN SECTION N/A 01/10/2016  ? Procedure: CESAREAN SECTION;  Surgeon: Catalina AntiguaPeggy Constant, MD;  Location: [facility];  Service: Obstetrics;   Laterality: N/A;  ? ? ?Medications:  ?Current Outpatient Medications on File Prior to Visit  ?Medication Sig  ? albuterol (PROVENTIL HFA;VENTOLIN HFA) 108 (90 Base) MCG/ACT inhaler Inhale 2 puffs into the lungs every 6 (six) hours as needed.  ? EPINEPHrine 0.3 mg/0.3 mL IJ SOAJ injection Inject 0.3 mg into the muscle as needed for anaphylaxis.   ? escitalopram (LEXAPRO) 10 MG tablet Take 1 tablet (10 mg total) by mouth daily.  ? JUNEL FE 1/20 1-20 MG-MCG tablet Take 1 tablet by mouth daily.  ? ?No current facility-administered medications on file prior to visit.  ? ? ?Allergies:  ?Allergies  ?Allergen Reactions  ? Shellfish Allergy Swelling  ? ? ?Social History:  ?Social History  ? ?Socioeconomic History  ? Marital status: Single  ?  Spouse name: Not on file  ? Number of children: Not on file  ? Years of education: Not on file  ? Highest education level: Not on file  ?Occupational History  ? Not on file  ?Tobacco Use  ? Smoking status: Former  ?  Packs/day: 0.20  ?  Years: 3.00  ?  Pack years: 0.60  ?  Types: Cigarettes  ?  Quit date: 2020  ?  Years since quitting: 3.3  ? Smokeless tobacco: Never  ?Vaping Use  ? Vaping Use: Every day  ?Substance and Sexual Activity  ? Alcohol use: Yes  ?  Alcohol/week: 1.0 standard drink  ?  Types: 1 Standard drinks or equivalent per week  ? Drug use: Not Currently  ?  Comment: on special occassions  ? Sexual activity: Yes  ?  Birth control/protection: None  ?Other Topics Concern  ? Not on file  ?Social History Narrative  ? Not on file  ? ?Social Determinants of Health  ? ?Financial Resource Strain: Medium Risk  ? Difficulty of Paying Living Expenses: Somewhat hard  ?Food Insecurity: No Food Insecurity  ? Worried About Programme researcher, broadcasting/film/videounning Out of Food in the Last Year: Never true  ? Ran Out of Food in the Last Year: Never true  ?Transportation Needs: No Transportation Needs  ? Lack of Transportation (Medical): No  ? Lack of Transportation (Non-Medical): No  ?Physical Activity: Sufficiently  Active  ? Days of Exercise per Week: 5 days  ? Minutes of Exercise per Session: 30 min  ?Stress: No Stress Concern Present  ? Feeling of Stress : Only a little  ?Social Connections: Moderately Isolated  ? Frequency of Communication with Friends and Family: More than three times a week  ? Frequency of Social Gatherings with Friends and Family: More than three times a week  ? Attends Religious Services: Never  ? Active Member of Clubs or Organizations: No  ? Attends Club  or Organization Meetings: Never  ? Marital Status: Living with partner  ?Intimate Partner Violence: Not At Risk  ? Fear of Current or Ex-Partner: No  ? Emotionally Abused: No  ? Physically Abused: No  ? Sexually Abused: No  ? ?Social History  ? ?Tobacco Use  ?Smoking Status Former  ? Packs/day: 0.20  ? Years: 3.00  ? Pack years: 0.60  ? Types: Cigarettes  ? Quit date: 2020  ? Years since quitting: 3.3  ?Smokeless Tobacco Never  ? ?Social History  ? ?Substance and Sexual Activity  ?Alcohol Use Yes  ? Alcohol/week: 1.0 standard drink  ? Types: 1 Standard drinks or equivalent per week  ? ? ?Family History:  ?Family History  ?Problem Relation Age of Onset  ? Hypertension Mother   ? Sickle cell trait Mother   ? Diabetes Mother   ? Cancer Paternal Uncle   ? Cancer Paternal Uncle   ? Cancer Paternal Uncle   ? Gout Maternal Grandfather   ? Hypertension Daughter   ?     pulmonary  ? Bladder Cancer Neg Hx   ? Kidney cancer Neg Hx   ? ? ?Past medical history, surgical history, medications, allergies, family history and social history reviewed with patient today and changes made to appropriate areas of the chart.  ? ?ROS ?All other ROS negative except what is listed above and in the HPI.  ? ?   ?Objective:  ?  ?BP 95/65   Pulse 65   Temp 98.2 ?F (36.8 ?C) (Oral)   Wt 131 lb 3.2 oz (59.5 kg)   SpO2 99%   BMI 24.79 kg/m?   ?Wt Readings from Last 3 Encounters:  ?09/02/21 131 lb 3.2 oz (59.5 kg)  ?06/17/21 132 lb 6 oz (60 kg)  ?06/10/21 131 lb 12.8 oz (59.8 kg)   ?  ?Physical Exam ? ?Results for orders placed or performed in visit on 06/10/21  ?Rapid Strep Screen ([facility] ONLY)  ? Specimen: Other  ? Other  ?Result Value Ref Range  ? Strep Gp A Ag, IA W/Reflex Negative Negative

## 2021-09-02 NOTE — Assessment & Plan Note (Signed)
Noted on past labs, check lipid panel outpatient and continue diet and exercise focus. ?

## 2021-09-02 NOTE — Assessment & Plan Note (Signed)
Chronic, exacerbated recently.  Denies SI/HI.  Recommend she start taking Lexapro daily to allow for more consistent benefit, educated her on this.  Consider dose increase in future if needed.  She will alert provider if no benefit from this and will follow-up. ?

## 2021-09-10 ENCOUNTER — Encounter: Payer: Self-pay | Admitting: Nurse Practitioner

## 2021-09-15 ENCOUNTER — Encounter: Payer: Self-pay | Admitting: Nurse Practitioner

## 2021-09-27 ENCOUNTER — Other Ambulatory Visit: Payer: Self-pay | Admitting: Nurse Practitioner

## 2021-09-28 NOTE — Telephone Encounter (Signed)
Requested medication (s) are due for refill today: Yes  Requested medication (s) are on the active medication list: Yes  Last refill:  04/11/21  Future visit scheduled: No  Notes to clinic:  Historical provider    Requested Prescriptions  Pending Prescriptions Disp Refills   JUNEL FE 1/20 1-20 MG-MCG tablet [Pharmacy Med Name: JUNEL FE 1 MG-20 MCG TABLET] 84 tablet 1    Sig: TAKE 1 TABLET BY MOUTH EVERY DAY.. START SUNDAY 04/11/21     OB/GYN:  Contraceptives Passed - 09/27/2021 10:35 AM      Passed - Last BP in normal range    BP Readings from Last 1 Encounters:  09/02/21 95/65         Passed - Valid encounter within last 12 months    Recent Outpatient Visits           3 weeks ago Anxiety   [facility] [place], [place] T, NP   3 months ago Sore throat   [facility] [place], [place] T, NP   5 months ago Sore throat   [facility] [place], Lauren A, NP   5 months ago Urinary frequency   [facility] [place], Lauren A, NP   7 months ago Possible exposure to STD   [facility], Jake [place], NP               Passed - Patient is not a smoker

## 2021-10-16 ENCOUNTER — Encounter: Payer: Self-pay | Admitting: Urgent Care

## 2021-10-16 ENCOUNTER — Ambulatory Visit
Admission: EM | Admit: 2021-10-16 | Discharge: 2021-10-16 | Disposition: A | Discharge status: Home / Self Care | Payer: Self-pay | Attending: Physician Assistant | Admitting: Physician Assistant

## 2021-10-16 ENCOUNTER — Other Ambulatory Visit: Payer: Self-pay

## 2021-10-16 DIAGNOSIS — J039 Acute tonsillitis, unspecified: Secondary | ICD-10-CM

## 2021-10-16 DIAGNOSIS — R5081 Fever presenting with conditions classified elsewhere: Secondary | ICD-10-CM

## 2021-10-16 HISTORY — DX: Streptococcal pharyngitis: J02.0

## 2021-10-16 LAB — GROUP A STREP BY PCR: Group A Strep by PCR: NOT DETECTED

## 2021-10-16 MED ORDER — CEFTRIAXONE SODIUM 1 G IJ SOLR
1.0000 g | Freq: Once | INTRAMUSCULAR | Status: AC
  Administered 2021-10-16: 1 g via INTRAMUSCULAR

## 2021-10-16 MED ORDER — CLINDAMYCIN HCL 300 MG PO CAPS: 300.0000 mg | ORAL_CAPSULE | Freq: Four times a day (QID) | ORAL | 0 refills | Status: AC

## 2021-10-16 MED ORDER — PREDNISONE 50 MG PO TABS: 50.0000 mg | ORAL_TABLET | Freq: Every day | ORAL | 0 refills | Status: DC

## 2021-11-22 ENCOUNTER — Telehealth: Payer: Self-pay | Admitting: Nurse Practitioner

## 2022-12-09 ENCOUNTER — Encounter: Payer: Self-pay | Admitting: Nurse Practitioner

## 2022-12-09 MED ORDER — NORGESTIMATE-ETH ESTRADIOL 0.25-35 MG-MCG PO TABS: 1.0000 | ORAL_TABLET | Freq: Every day | ORAL | 6 refills | Status: DC

## 2023-04-02 NOTE — Patient Instructions (Signed)
Preventing Sexually Transmitted Infections, Adult Sexually transmitted infections (STIs) are spread from person to person (are contagious). They are spread, or transmitted, during sex. The sex may be vaginal, anal, or oral. STIs can be passed during sexual contact with skin, genitals, mouth, or rectum. They may spread through body fluids, such as saliva, semen, blood, vaginal mucus, and urine. STIs are very common. They can happen in people of all ages. Some common STIs are: Herpes. Hepatitis B. Chlamydia. Gonorrhea. Syphilis. Trichomoniasis. Human papillomavirus (HPV). Human immunodeficiency virus (HIV). This can cause acquired immunodeficiency syndrome (AIDS). How can STIs affect me? You may not have symptoms with an STI. Even if you do not have symptoms, you can still spread the infection to others. You also still need treatment. STIs can be treated. Some STIs can be cured. Other STIs cannot be cured and will affect you for the rest of your life. Certain STIs may: Require you to take medicine for the rest of your life. Affect your ability to have children. Increase your risk for getting other STIs. Increase your risk of getting certain conditions. These may include: Cervical cancer. Pelvic inflammatory disease (PID). Organ damage or damage to other parts of your body. This can happen if the infection spreads. Cause problems during pregnancy. STIs may be spread to the baby during pregnancy or birth. Females tend to have more severe problems from STIs than males. What can increase my risk? You may be more at risk for an STI if: You do not use protection during sex. You have more than one sex partner. You have a sex partner who has other sex partners. You have sex with a person who has an STI. You have an STI, or you have had an STI before. You inject drugs or have a sex partner who injects drugs. What actions can I take to prevent STIs? The only way to fully prevent STIs is not to  have sex of any kind. This is called practicing abstinence. If you are sexually active, you can protect yourself and others by taking these actions to lower your risk of getting an STI: Lifestyle Have only one sex partner or limit the number of sex partners you have. Avoid having sex after you have alcohol or drugs. Alcohol and drugs can affect your ability to make good choices. This can lead to risky sexual behaviors. Go to prevention counseling. This can teach you how to avoid getting an STI. Barrier protection  Use methods to stop body fluids from being exchanged between partners during sex (barrier protection). These methods can be used during oral, vaginal, or anal sex. They include: External condom, for males. Internal condom, for females. Dental dam. Use a new barrier method for every sex act from start to finish. Know that a barrier method may not protect you from all STIs. Some STIs, such as herpes, are spread through skin-to-skin contact. Avoid all sexual contact if you or a partner has herpes and there is an active flare with open sores. Birth control pills, injections, implants, and intrauterine devices (IUDs) do not protect against STIs. To prevent both STIs and pregnancy, always use a condom with a second form of birth control. General information Ask your health care provider about taking pre-exposure prophylaxis (PrEP) to prevent HIV. Stay up to date on your vaccines. Some vaccines can lower your risk of getting certain STIs. These include: Hepatitis B vaccine. HPV vaccine. This is recommended for people up to age 26. Get tested for STIs. Have your   partners get tested, too. If you test positive for an STI, follow recommendations from your health care provider about treatment. Make sure your sex partners are tested and treated as well. Where to find more information Learn more about STIs from: Centers for Disease Control and Prevention (CDC): More information about certain  STIs: cdc.gov Places to get sexual health counseling and treatment for free or at a low cost: gettested.cdc.gov U.[place] Department of Health and Human Services (HHS): womenshealth.gov This information is not intended to replace advice given to you by your health care provider. Make sure you discuss any questions you have with your health care provider. Document Revised: 04/21/2022 Document Reviewed: 09/24/2021 Elsevier Patient Education  2024 Elsevier Inc.  

## 2023-04-05 ENCOUNTER — Ambulatory Visit (INDEPENDENT_AMBULATORY_CARE_PROVIDER_SITE_OTHER): Payer: Self-pay | Admitting: Nurse Practitioner

## 2023-04-05 ENCOUNTER — Other Ambulatory Visit (HOSPITAL_COMMUNITY)
Admission: RE | Admit: 2023-04-05 | Discharge: 2023-04-05 | Disposition: A | Discharge status: Home / Self Care | Payer: Self-pay | Source: Ambulatory Visit | Attending: Nurse Practitioner | Admitting: Nurse Practitioner

## 2023-04-05 ENCOUNTER — Encounter: Payer: Self-pay | Admitting: Nurse Practitioner

## 2023-04-05 VITALS — BP 100/68 | HR 73 | Temp 98.2°F | Ht 61.5 in | Wt 119.6 lb

## 2023-04-05 DIAGNOSIS — R634 Abnormal weight loss: Secondary | ICD-10-CM | POA: Insufficient documentation

## 2023-04-05 DIAGNOSIS — R399 Unspecified symptoms and signs involving the genitourinary system: Secondary | ICD-10-CM

## 2023-04-05 DIAGNOSIS — Z113 Encounter for screening for infections with a predominantly sexual mode of transmission: Secondary | ICD-10-CM

## 2023-04-05 DIAGNOSIS — Z124 Encounter for screening for malignant neoplasm of cervix: Secondary | ICD-10-CM | POA: Insufficient documentation

## 2023-04-05 DIAGNOSIS — F419 Anxiety disorder, unspecified: Secondary | ICD-10-CM

## 2023-04-05 LAB — MICROSCOPIC EXAMINATION: Bacteria, UA: NONE SEEN

## 2023-04-05 LAB — URINALYSIS, ROUTINE W REFLEX MICROSCOPIC
Bilirubin, UA: NEGATIVE
Glucose, UA: NEGATIVE
Ketones, UA: NEGATIVE
Nitrite, UA: NEGATIVE
Specific Gravity, UA: 1.025 (ref 1.005–1.030)
Urobilinogen, Ur: 0.2 mg/dL (ref 0.2–1.0)
pH, UA: 5.5 (ref 5.0–7.5)

## 2023-04-05 NOTE — Assessment & Plan Note (Signed)
Over past year has lost 16 pounds without attempting to lose weight.  Denies any B or red flag symptoms.  Has active appetite.  Will check CMP and TSH today.  Recommend she start drinking protein shake or eating protein bar every morning.  Ensure 3 meals a day.  Monitor weight closely at home. Return in 2 months for weight check.

## 2023-04-05 NOTE — Assessment & Plan Note (Signed)
Chronic, stable without Lexapro at present.  Denies SI/HI.  Will return to Lexapro as needed in future.

## 2023-04-05 NOTE — Progress Notes (Signed)
BP 100/68   Pulse 73   Temp 98.2 F (36.8 C) (Oral)   Ht 5' 1.5" (1.562 m)   Wt 119 lb 9.6 oz (54.3 kg)   LMP 04/04/2023 (Exact Date)   SpO2 98%   BMI 22.23 kg/m    Subjective:    Patient ID: Allison Sanchez, female    DOB: August 20, 1995, 27 y.o.   MRN: [id number]  HPI: Allison Sanchez is a 27 y.o. female  Chief Complaint  Patient presents with   Anxiety   Weight Loss    Patient states that she feels like she has been losing a lot of weight over the last year. States she has not changed anything as far as her eating habits. States she has lost about 20 pounds total.    STD Labs   STD SCREENING Sexual activity:  Recent unprotected sexual encounter Contraception: yes Recent unprotected intercourse: yes History of sexually transmitted diseases: no Previous sexually transmitted disease screening: no Lifetime sexual partners: 20 Genital lesions: no Dysuria: yes -- recently having symptoms.  Had yeast infection recently which was treated - she had to take 2 doses Diflucan for this.  Swollen lymph nodes: no Fevers: no Rash: no   ANXIETY/STRESS Has Lexapro, but has been off of this for a year now.  Has been doing well.  Occasional anxiety.  No heart palpitations.  Has lost 16 pounds since last year.  Has not been trying to lose weight.  Is eating 2 meals a day, lunch and dinner.  Fast food lunch time and dinner fast food or cooking.  Denies fever or night sweats. Is very active, walking in her job.  She did have time earlier in year when her hair did come out a lot.  Denies abdominal pain or reduced appetite.  At baseline has abnormal bowel regimen, can go a week without BM.   Duration:stable Anxious mood: occasional  Excessive worrying: yes Irritability:  a little   Sweating: no Nausea: no Palpitations:no Hyperventilation: no Panic attacks: no Agoraphobia: no  Obscessions/compulsions: no Depressed mood: very little    April 18, 2023    2:56 PM 09/02/2021    9:16 AM  04/13/2021    9:05 AM 02/25/2021    3:44 PM 04/06/2020   11:23 AM  Depression screen PHQ 2/9  Decreased Interest 1 3 2 1 1   Down, Depressed, Hopeless 1 2 1 1 1   PHQ - 2 Score 2 5 3 2 2   Altered sleeping 0 2 1 0 1  Tired, decreased energy 2 3 2 2 1   Change in appetite 0 1 1 1 1   Feeling bad or failure about yourself  1 3 2 1 1   Trouble concentrating 1 1 0 0 1  Moving slowly or fidgety/restless 0 0 0 0 0  Suicidal thoughts 0 0 0 0 0  PHQ-9 Score 6 15 9 6 7   Difficult doing work/chores Somewhat difficult  Very difficult  Somewhat difficult  Anhedonia: no Weight changes: no Insomnia: no Hypersomnia: no Fatigue/loss of energy: no Feelings of worthlessness: no Feelings of guilt: no Impaired concentration/indecisiveness: no Suicidal ideations: no  Crying spells: no Recent Stressors/Life Changes: no   Relationship problems: no   Family stress: no     Financial stress: no    Job stress: no    Recent death/loss: no     04/18/23    2:57 PM 09/02/2021    9:17 AM 04/13/2021    9:06 AM 02/25/2021    3:45 PM  GAD 7 : Generalized Anxiety Score  Nervous, Anxious, on Edge 1 1 1 1   Control/stop worrying 2 1 2 1   Worry too much - different things 3 2 3 1   Trouble relaxing 0 0 0 0  Restless 0 0 0 0  Easily annoyed or irritable 2 3 3 2   Afraid - awful might happen 1 3 2  0  Total GAD 7 Score 9 10 11 5   Anxiety Difficulty Somewhat difficult Somewhat difficult Very difficult Somewhat difficult   Relevant past medical, surgical, family and social history reviewed and updated as indicated. Interim medical history since our last visit reviewed. Allergies and medications reviewed and updated.  Review of Systems  Constitutional:  Negative for activity change, appetite change, diaphoresis, fatigue and fever.  Respiratory:  Negative for cough, chest tightness, shortness of breath and wheezing.   Cardiovascular:  Negative for chest pain, palpitations and leg swelling.  Gastrointestinal:  Negative.   Endocrine: Negative for cold intolerance and heat intolerance.  Neurological: Negative.   Psychiatric/Behavioral: Negative.     Per HPI unless specifically indicated above     Objective:    BP 100/68   Pulse 73   Temp 98.2 F (36.8 C) (Oral)   Ht 5' 1.5" (1.562 m)   Wt 119 lb 9.6 oz (54.3 kg)   LMP 04/04/2023 (Exact Date)   SpO2 98%   BMI 22.23 kg/m   Wt Readings from Last 3 Encounters:  04/05/23 119 lb 9.6 oz (54.3 kg)  10/16/21 135 lb (61.2 kg)  09/02/21 131 lb 3.2 oz (59.5 kg)    Physical Exam Vitals and nursing note reviewed. Exam conducted with a chaperone present.  Constitutional:      General: She is awake. She is not in acute distress.    Appearance: She is well-developed and well-groomed. She is not ill-appearing or toxic-appearing.  HENT:     Head: Normocephalic.     Right Ear: Hearing and external ear normal.     Left Ear: Hearing and external ear normal.  Eyes:     General: Lids are normal.        Right eye: No discharge.        Left eye: No discharge.     Conjunctiva/sclera: Conjunctivae normal.     Pupils: Pupils are equal, round, and reactive to light.  Neck:     Thyroid: No thyromegaly.     Vascular: No carotid bruit.  Cardiovascular:     Rate and Rhythm: Normal rate and regular rhythm.     Heart sounds: Normal heart sounds. No murmur heard.    No gallop.  Pulmonary:     Effort: Pulmonary effort is normal. No accessory muscle usage or respiratory distress.     Breath sounds: Normal breath sounds.  Abdominal:     General: Bowel sounds are normal. There is no distension.     Palpations: Abdomen is soft.     Tenderness: There is no abdominal tenderness.     Hernia: There is no hernia in the left inguinal area or right inguinal area.  Genitourinary:    Exam position: Lithotomy position.     Labia:        Right: No rash.        Left: No rash.      Urethra: No prolapse.     Vagina: Normal.     Cervix: Normal.     Uterus: Normal.       Adnexa: Right adnexa normal and left adnexa normal.  Comments: Is on cycle, able to clear area and obtain pap.  Cervix anterior and viewed. Musculoskeletal:     Cervical back: Normal range of motion and neck supple.     Right lower leg: No edema.     Left lower leg: No edema.  Lymphadenopathy:     Cervical: No cervical adenopathy.  Skin:    General: Skin is warm and dry.  Neurological:     Mental Status: She is alert and oriented to person, place, and time.     Deep Tendon Reflexes: Reflexes are normal and symmetric.     Reflex Scores:      Brachioradialis reflexes are 2+ on the right side and 2+ on the left side.      Patellar reflexes are 2+ on the right side and 2+ on the left side. Psychiatric:        Attention and Perception: Attention normal.        Mood and Affect: Mood normal.        Speech: Speech normal.        Behavior: Behavior normal. Behavior is cooperative.        Thought Content: Thought content normal.     Results for orders placed or performed during the hospital encounter of 10/16/21  Group A Strep by PCR   Specimen: Throat; Sterile Swab  Result Value Ref Range   Group A Strep by PCR NOT DETECTED NOT DETECTED      Assessment & Plan:   Problem List Items Addressed This Visit       Other   Anxiety - Primary    Chronic, stable without Lexapro at present.  Denies SI/HI.  Will return to Lexapro as needed in future.      Weight loss, abnormal    Over past year has lost 16 pounds without attempting to lose weight.  Denies any B or red flag symptoms.  Has active appetite.  Will check CMP and TSH today.  Recommend she start drinking protein shake or eating protein bar every morning.  Ensure 3 meals a day.  Monitor weight closely at home. Return in 2 months for weight check.      Relevant Orders   Comprehensive metabolic panel   TSH   Other Visit Diagnoses     Screen for STD (sexually transmitted disease)       STD labs obtained today per patient  request.   Relevant Orders   HIV Antibody (routine testing w rflx)   GC/Chlamydia Probe Amp   RPR   HSV 1 and 2 Ab, IgG   Urinary symptom or sign       UA obtained with some abnormal findings, will send for culture and treat as needed.   Relevant Orders   Urinalysis, Routine w reflex microscopic   Urine Culture   Cervical cancer screening       Pap obtained today.   Relevant Orders   Cytology - PAP        Follow up plan: Return in about 2 months (around 06/06/2023) for WEIGHT CHECK.

## 2023-04-06 ENCOUNTER — Encounter: Payer: Self-pay | Admitting: Nurse Practitioner

## 2023-04-06 LAB — COMPREHENSIVE METABOLIC PANEL
ALT: 11 [IU]/L (ref 0–32)
AST: 15 [IU]/L (ref 0–40)
Albumin: 4 g/dL (ref 4.0–5.0)
Alkaline Phosphatase: 80 [IU]/L (ref 44–121)
BUN/Creatinine Ratio: 8 — ABNORMAL LOW (ref 9–23)
BUN: 6 mg/dL (ref 6–20)
Bilirubin Total: 0.3 mg/dL (ref 0.0–1.2)
CO2: 21 mmol/L (ref 20–29)
Calcium: 9.6 mg/dL (ref 8.7–10.2)
Chloride: 104 mmol/L (ref 96–106)
Creatinine, Ser: 0.77 mg/dL (ref 0.57–1.00)
Globulin, Total: 3.3 g/dL (ref 1.5–4.5)
Glucose: 70 mg/dL (ref 70–99)
Potassium: 3.9 mmol/L (ref 3.5–5.2)
Sodium: 141 mmol/L (ref 134–144)
Total Protein: 7.3 g/dL (ref 6.0–8.5)
eGFR: 110 mL/min/{1.73_m2} (ref >59)

## 2023-04-06 LAB — HSV 1 AND 2 AB, IGG
HSV 1 Glycoprotein G Ab, IgG: NONREACTIVE
HSV 2 IgG, Type Spec: NONREACTIVE

## 2023-04-06 LAB — HIV ANTIBODY (ROUTINE TESTING W REFLEX): HIV Screen 4th Generation wRfx: NONREACTIVE

## 2023-04-06 LAB — TSH: TSH: 0.697 u[IU]/mL (ref 0.450–4.500)

## 2023-04-06 LAB — RPR: RPR Ser Ql: NONREACTIVE

## 2023-04-06 NOTE — Progress Notes (Signed)
Contacted via MyChart   Good morning, your labs are starting to return and at this time all is stable. Any questions? Keep being excellent!!  Thank you for allowing me to participate in your care.  I appreciate you. Kindest regards, Kamirah Shugrue

## 2023-04-07 ENCOUNTER — Other Ambulatory Visit: Payer: Self-pay | Admitting: Nurse Practitioner

## 2023-04-07 LAB — URINE CULTURE

## 2023-04-07 MED ORDER — NITROFURANTOIN MONOHYD MACRO 100 MG PO CAPS: 100.0000 mg | ORAL_CAPSULE | Freq: Two times a day (BID) | ORAL | 0 refills | Status: DC

## 2023-04-08 ENCOUNTER — Encounter: Payer: Self-pay | Admitting: Nurse Practitioner

## 2023-04-08 ENCOUNTER — Other Ambulatory Visit: Payer: Self-pay | Admitting: Nurse Practitioner

## 2023-04-08 DIAGNOSIS — A749 Chlamydial infection, unspecified: Secondary | ICD-10-CM

## 2023-04-08 LAB — GC/CHLAMYDIA PROBE AMP
Chlamydia trachomatis, NAA: POSITIVE — AB
Neisseria Gonorrhoeae by PCR: NEGATIVE

## 2023-04-08 MED ORDER — SULFAMETHOXAZOLE-TRIMETHOPRIM 800-160 MG PO TABS: 1.0000 | ORAL_TABLET | Freq: Two times a day (BID) | ORAL | 0 refills | Status: AC

## 2023-04-08 MED ORDER — DOXYCYCLINE HYCLATE 100 MG PO TABS: 100.0000 mg | ORAL_TABLET | Freq: Two times a day (BID) | ORAL | 0 refills | Status: AC

## 2023-04-08 NOTE — Progress Notes (Signed)
Please call and ensure patient received below message + she will need a 3 month lab only visit - thank you:): Good morning Allison Sanchez, your urine sample did return showing it is resistant to Macrobid that was sent in, I have sent in Bactrim to replace this which infection is able to be treated with.  You are also noted to be positive for chlamydia, I am sending in Doxycycline to take twice a day for this and you will need a lab only visit scheduled for 3 months to recheck urine.  Any questions?  My staff will call to schedule lab only visit. Keep being amazing!!  Thank you for allowing me to participate in your care.  I appreciate you. Kindest regards, Flannery Cavallero

## 2023-04-11 ENCOUNTER — Encounter: Payer: Self-pay | Admitting: Nurse Practitioner

## 2023-04-11 LAB — CYTOLOGY - PAP
Comment: NEGATIVE
Diagnosis: UNDETERMINED — AB
High risk HPV: NEGATIVE

## 2023-04-11 NOTE — Progress Notes (Signed)
Contacted via MyChart   Good afternoon Allison Sanchez pap has returned. Previous pap was normal.  This pap is showing ASC-US, some abnormal cells, but HPV (high risk screening) is negative.  The recommendations for this is to repeat pap in 3 years.  If continue to have abnormal findings then may send to GYN at that point.  Any questions? Keep being amazing!!  Thank you for allowing me to participate in your care.  I appreciate you. Kindest regards, Cohl Behrens

## 2023-04-13 ENCOUNTER — Encounter: Payer: Self-pay | Admitting: Nurse Practitioner

## 2023-04-14 MED ORDER — FLUCONAZOLE 150 MG PO TABS: 150.0000 mg | ORAL_TABLET | Freq: Once | ORAL | 0 refills | Status: AC

## 2023-05-02 ENCOUNTER — Ambulatory Visit: Payer: Self-pay | Admitting: Nurse Practitioner

## 2023-05-14 NOTE — Patient Instructions (Signed)

## 2023-05-17 ENCOUNTER — Ambulatory Visit (INDEPENDENT_AMBULATORY_CARE_PROVIDER_SITE_OTHER): Payer: Self-pay | Admitting: Nurse Practitioner

## 2023-05-17 ENCOUNTER — Encounter: Payer: Self-pay | Admitting: Nurse Practitioner

## 2023-05-17 VITALS — BP 99/66 | HR 78 | Temp 97.9°F | Wt 118.8 lb

## 2023-05-17 DIAGNOSIS — F419 Anxiety disorder, unspecified: Secondary | ICD-10-CM

## 2023-05-17 DIAGNOSIS — R8281 Pyuria: Secondary | ICD-10-CM

## 2023-05-17 DIAGNOSIS — N760 Acute vaginitis: Secondary | ICD-10-CM | POA: Insufficient documentation

## 2023-05-17 DIAGNOSIS — B9689 Other specified bacterial agents as the cause of diseases classified elsewhere: Secondary | ICD-10-CM | POA: Insufficient documentation

## 2023-05-17 LAB — URINALYSIS, ROUTINE W REFLEX MICROSCOPIC
Bilirubin, UA: NEGATIVE
Glucose, UA: NEGATIVE
Ketones, UA: NEGATIVE
Nitrite, UA: POSITIVE — AB
Protein,UA: NEGATIVE
Specific Gravity, UA: 1.02 (ref 1.005–1.030)
Urobilinogen, Ur: 1 mg/dL (ref 0.2–1.0)
pH, UA: 6 (ref 5.0–7.5)

## 2023-05-17 LAB — WET PREP FOR TRICH, YEAST, CLUE
Clue Cell Exam: POSITIVE — AB
Trichomonas Exam: NEGATIVE
Yeast Exam: POSITIVE — AB

## 2023-05-17 LAB — MICROSCOPIC EXAMINATION

## 2023-05-17 MED ORDER — FLUCONAZOLE 150 MG PO TABS: 150.0000 mg | ORAL_TABLET | Freq: Once | ORAL | 0 refills | Status: AC

## 2023-05-17 MED ORDER — ESCITALOPRAM OXALATE 10 MG PO TABS: 10.0000 mg | ORAL_TABLET | Freq: Every day | ORAL | 4 refills | Status: AC

## 2023-05-17 MED ORDER — METRONIDAZOLE 0.75 % VA GEL: 1.0000 | Freq: Every day | VAGINAL | 0 refills | Status: DC

## 2023-05-17 NOTE — Assessment & Plan Note (Signed)
Acute with wet prep + for clue cells and yeast.  Oral medications do not work well for BV for her, gel works better.  Will send in vaginal Metrogel to use at night for 7 days + Diflucan for one dose for yeast. Recommend to return to office if ongoing symptoms as may need to change to Chlamydia.

## 2023-05-17 NOTE — Assessment & Plan Note (Signed)
Chronic, ongoing.  Denies SI/HI.  Needs refills on Lexapro, offers benefit.

## 2023-05-17 NOTE — Progress Notes (Addendum)
BP 99/66   Pulse 78   Temp 97.9 F (36.6 C) (Oral)   Wt 118 lb 12.8 oz (53.9 kg)   BMI 22.08 kg/m    Subjective:    Patient ID: Allison Sanchez, female    DOB: January 23, 1996, 28 y.o.   MRN: [id number]  HPI: Allison Sanchez is a 28 y.o. female  Chief Complaint  Patient presents with   Vaginal Discharge    Patient states she has been having vaginal discharge and a fishy odor as well. States her urine has a strong smell as well. States she has had symptoms for the last week.    VAGINAL DISCHARGE Started with discharge about one week ago.  Is sexually active, partner tested negative for chlamydia.  She was treated for this one month ago and is to retest coming up. Duration: weeks Discharge description: yellow  Pruritus: no Dysuria: no Malodorous: yes Urinary frequency: yes Fevers: no Abdominal pain: no  Sexual activity: monogamous History of sexually transmitted diseases: yes Recent antibiotic use: no Context: stable - no changes  Treatments attempted: she did take some Flagyl that was leftover at home, had BV after chlamydia   Is out of Lexapro and needs refill.    05/17/2023   11:57 AM 04/05/2023    2:56 PM 09/02/2021    9:16 AM 04/13/2021    9:05 AM 02/25/2021    3:44 PM  Depression screen PHQ 2/9  Decreased Interest 1 1 3 2 1   Down, Depressed, Hopeless 1 1 2 1 1   PHQ - 2 Score 2 2 5 3 2   Altered sleeping 2 0 2 1 0  Tired, decreased energy 2 2 3 2 2   Change in appetite 2 0 1 1 1   Feeling bad or failure about yourself  1 1 3 2 1   Trouble concentrating 1 1 1  0 0  Moving slowly or fidgety/restless 1 0 0 0 0  Suicidal thoughts 0 0 0 0 0  PHQ-9 Score 11 6 15 9 6   Difficult doing work/chores Somewhat difficult Somewhat difficult  Very difficult        05/17/2023   11:57 AM 04/05/2023    2:57 PM 09/02/2021    9:17 AM 04/13/2021    9:06 AM  GAD 7 : Generalized Anxiety Score  Nervous, Anxious, on Edge 1 1 1 1   Control/stop worrying 1 2 1 2   Worry too much -  different things 2 3 2 3   Trouble relaxing 0 0 0 0  Restless 0 0 0 0  Easily annoyed or irritable 2 2 3 3   Afraid - awful might happen 1 1 3 2   Total GAD 7 Score 7 9 10 11   Anxiety Difficulty Somewhat difficult Somewhat difficult Somewhat difficult Very difficult      Relevant past medical, surgical, family and social history reviewed and updated as indicated. Interim medical history since our last visit reviewed. Allergies and medications reviewed and updated.  Review of Systems  Constitutional:  Negative for activity change, appetite change, diaphoresis, fatigue and fever.  Respiratory:  Negative for cough, chest tightness, shortness of breath and wheezing.   Cardiovascular:  Negative for chest pain, palpitations and leg swelling.  Gastrointestinal: Negative.   Endocrine: Negative for cold intolerance and heat intolerance.  Genitourinary:  Positive for frequency and vaginal discharge. Negative for dysuria, hematuria, pelvic pain, urgency, vaginal bleeding and vaginal pain.  Neurological: Negative.   Psychiatric/Behavioral: Negative.     Per HPI unless specifically indicated above  Objective:    BP 99/66   Pulse 78   Temp 97.9 F (36.6 C) (Oral)   Wt 118 lb 12.8 oz (53.9 kg)   BMI 22.08 kg/m   Wt Readings from Last 3 Encounters:  05/17/23 118 lb 12.8 oz (53.9 kg)  04/05/23 119 lb 9.6 oz (54.3 kg)  10/16/21 135 lb (61.2 kg)    Physical Exam Vitals and nursing note reviewed.  Constitutional:      General: She is awake. She is not in acute distress.    Appearance: She is well-developed and well-groomed. She is not ill-appearing or toxic-appearing.  HENT:     Head: Normocephalic.     Right Ear: Hearing and external ear normal.     Left Ear: Hearing and external ear normal.  Eyes:     General: Lids are normal.        Right eye: No discharge.        Left eye: No discharge.     Conjunctiva/sclera: Conjunctivae normal.     Pupils: Pupils are equal, round, and  reactive to light.  Neck:     Thyroid: No thyromegaly.     Vascular: No carotid bruit.  Cardiovascular:     Rate and Rhythm: Normal rate and regular rhythm.     Heart sounds: Normal heart sounds. No murmur heard.    No gallop.  Pulmonary:     Effort: Pulmonary effort is normal. No accessory muscle usage or respiratory distress.     Breath sounds: Normal breath sounds.  Abdominal:     General: Bowel sounds are normal. There is no distension.     Palpations: Abdomen is soft.     Tenderness: There is no abdominal tenderness.  Musculoskeletal:     Cervical back: Normal range of motion and neck supple.     Right lower leg: No edema.     Left lower leg: No edema.  Lymphadenopathy:     Cervical: No cervical adenopathy.  Skin:    General: Skin is warm and dry.  Neurological:     Mental Status: She is alert and oriented to person, place, and time.     Deep Tendon Reflexes: Reflexes are normal and symmetric.     Reflex Scores:      Brachioradialis reflexes are 2+ on the right side and 2+ on the left side.      Patellar reflexes are 2+ on the right side and 2+ on the left side. Psychiatric:        Attention and Perception: Attention normal.        Mood and Affect: Mood normal.        Speech: Speech normal.        Behavior: Behavior normal. Behavior is cooperative.        Thought Content: Thought content normal.    Results for orders placed or performed in visit on 04/05/23  Cytology - PAP   Collection Time: 04/05/23  2:50 PM  Result Value Ref Range   High risk HPV Negative    Adequacy      Satisfactory for evaluation; transformation zone component PRESENT.   Diagnosis (A)     - Atypical squamous cells of undetermined significance (ASC-US)   Comment Normal Reference Range HPV - Negative   GC/Chlamydia Probe Amp   Collection Time: 04/05/23  2:52 PM   Specimen: Urine   UR  Result Value Ref Range   Chlamydia trachomatis, NAA Positive (A) Negative   Neisseria Gonorrhoeae by PCR  Negative Negative  Microscopic Examination   Collection Time: 04/05/23  2:52 PM   Urine  Result Value Ref Range   WBC, UA 11-30 (A) 0 - 5 /hpf   RBC, Urine 3-10 (A) 0 - 2 /hpf   Epithelial Cells (non renal) 0-10 0 - 10 /hpf   Bacteria, UA None seen None seen/Few  Urinalysis, Routine w reflex microscopic   Collection Time: 04/05/23  2:52 PM  Result Value Ref Range   Specific Gravity, UA 1.025 1.005 - 1.030   pH, UA 5.5 5.0 - 7.5   Color, UA Yellow Yellow   Appearance Ur Cloudy (A) Clear   Leukocytes,UA 3+ (A) Negative   Protein,UA 2+ (A) Negative/Trace   Glucose, UA Negative Negative   Ketones, UA Negative Negative   RBC, UA 3+ (A) Negative   Bilirubin, UA Negative Negative   Urobilinogen, Ur 0.2 0.2 - 1.0 mg/dL   Nitrite, UA Negative Negative   Microscopic Examination See below:   HIV Antibody (routine testing w rflx)   Collection Time: 04/05/23  2:55 PM  Result Value Ref Range   HIV Screen 4th Generation wRfx Non Reactive Non Reactive  RPR   Collection Time: 04/05/23  2:55 PM  Result Value Ref Range   RPR Ser Ql Non Reactive Non Reactive  HSV 1 and 2 Ab, IgG   Collection Time: 04/05/23  2:55 PM  Result Value Ref Range   HSV 1 Glycoprotein G Ab, IgG Non Reactive Non Reactive   HSV 2 IgG, Type Spec Non Reactive Non Reactive  Comprehensive metabolic panel   Collection Time: 04/05/23  3:11 PM  Result Value Ref Range   Glucose 70 70 - 99 mg/dL   BUN 6 6 - 20 mg/dL   Creatinine, Ser 5.40 0.57 - 1.00 mg/dL   eGFR 981 >19 JY/NWG/9.56   BUN/Creatinine Ratio 8 (L) 9 - 23   Sodium 141 134 - 144 mmol/L   Potassium 3.9 3.5 - 5.2 mmol/L   Chloride 104 96 - 106 mmol/L   CO2 21 20 - 29 mmol/L   Calcium 9.6 8.7 - 10.2 mg/dL   Total Protein 7.3 6.0 - 8.5 g/dL   Albumin 4.0 4.0 - 5.0 g/dL   Globulin, Total 3.3 1.5 - 4.5 g/dL   Bilirubin Total 0.3 0.0 - 1.2 mg/dL   Alkaline Phosphatase 80 44 - 121 IU/L   AST 15 0 - 40 IU/L   ALT 11 0 - 32 IU/L  TSH   Collection Time: 04/05/23   3:11 PM  Result Value Ref Range   TSH 0.697 0.450 - 4.500 uIU/mL  Urine Culture   Collection Time: 04/05/23  3:53 PM   Specimen: Urine   UR  Result Value Ref Range   Urine Culture, Routine Final report (A)    Organism ID, Bacteria Klebsiella aerogenes (A)    Antimicrobial Susceptibility Comment       Assessment & Plan:   Problem List Items Addressed This Visit       Genitourinary   Bacterial vaginosis - Primary   Acute with wet prep + for clue cells and yeast.  Oral medications do not work well for BV for her, gel works better.  Will send in vaginal Metrogel to use at night for 7 days + Diflucan for one dose for yeast. Recommend to return to office if ongoing symptoms as may need to change to Chlamydia.      Relevant Medications   fluconazole (DIFLUCAN) 150 MG tablet  Other Relevant Orders   Urinalysis, Routine w reflex microscopic   WET PREP FOR TRICH, YEAST, CLUE     Other   Anxiety   Chronic, ongoing.  Denies SI/HI.  Needs refills on Lexapro, offers benefit.      Relevant Medications   escitalopram (LEXAPRO) 10 MG tablet   Other Visit Diagnoses       Pyuria       Urine sent for culture.  Will treat if + for infection.   Relevant Orders   Urine Culture        Follow up plan: Return if symptoms worsen or fail to improve.

## 2023-05-23 ENCOUNTER — Encounter: Payer: Self-pay | Admitting: Nurse Practitioner

## 2023-05-23 ENCOUNTER — Other Ambulatory Visit: Payer: Self-pay | Admitting: Nurse Practitioner

## 2023-05-23 LAB — URINE CULTURE

## 2023-05-23 MED ORDER — AMOXICILLIN-POT CLAVULANATE 875-125 MG PO TABS: 1.0000 | ORAL_TABLET | Freq: Two times a day (BID) | ORAL | 0 refills | Status: AC

## 2023-05-23 MED ORDER — FLUCONAZOLE 150 MG PO TABS: 150.0000 mg | ORAL_TABLET | Freq: Once | ORAL | 0 refills | Status: AC

## 2023-05-23 NOTE — Progress Notes (Signed)
Contacted via MyChart   Your urine is showing infection too:( I am sending in Augmentin which is susceptible to this.  I will also send in yeast medication in case you get yeast infections with antibiotics.:) Any questions?

## 2023-06-06 ENCOUNTER — Ambulatory Visit: Payer: Self-pay | Admitting: Nurse Practitioner

## 2023-06-23 ENCOUNTER — Ambulatory Visit: Payer: Self-pay | Admitting: Nurse Practitioner

## 2023-07-02 DIAGNOSIS — A568 Sexually transmitted chlamydial infection of other sites: Secondary | ICD-10-CM | POA: Insufficient documentation

## 2023-07-02 NOTE — Patient Instructions (Signed)
 Chlamydia, Female Chlamydia is a sexually transmitted infection (STI). This infection spreads through sexual contact. The infection can grow in: The urethra. This is the part of the body that drains pee (urine) from the bladder. The cervix. This is the lowest part of the womb (uterus). The throat. The opening of the butt (rectum). This condition is not hard to treat. But if it is not treated, it can cause worse health problems. You may have a higher risk of not being able to have children. Also, if you are pregnant or get pregnant and have untreated chlamydia: It can cause serious problems during pregnancy. It can spread to your baby during delivery and cause your baby to have health problems. What are the causes?  This condition is caused by a germ (bacteria) called Chlamydia trachomatis. These germs are spread from an infected partner during sex. The infection can spread through contact with the genitals, mouth, or the opening of the butt (rectum). What increases the risk? Not using a condom the right way. Not using a condom every time you have sex. Having a new sex partner. Having more than one sex partner. Being sexually active before age 66. What are the signs or symptoms? In some cases, there are no symptoms, especially early in the illness. If you get symptoms, they may include: Peeing often, or a burning feeling when you pee. Redness, soreness, or swelling of the vagina or butt. Fluid (discharge) coming from the vagina or butt. Pain the belly (abdomen). Pain during sex. Bleeding between monthly periods or irregular periods. How is this treated? This condition is treated with antibiotic medicines. Follow these instructions at home: Sexual activity Tell your sex partner or partners about your infection. Sex partners are people you had oral, anal, or vaginal sex with within 60 days of when you started getting sick. They need treatment even if they do not feel or seem sick. Do  not have sex until: You and your sex partners have been treated. Your doctor says it is okay. If you get just one dose of medicine, wait at least 7 days before having sex. General instructions Take over-the-counter and prescription medicines as told by your doctor. Finish your antibiotics even if you start to feel better. It is up to you to get your test results. Ask how to get your results when they are ready. Keep all follow-up visits. You may need tests after 3 months. How is this prevented? To lower your risk: Use latex or polyurethane condoms the right way. Do this every time you have sex. Do not have many sex partners. Ask if your sex partner got tested for STIs and had negative results. Get regular health screenings to check for STIs. Contact a doctor if: You get new symptoms. Your symptoms are getting worse or do not get better with treatment. You have a fever or chills. You have pain during sex. Your periods are irregular. You bleed between periods or after sex. You get flu-like symptoms. These may be: Night sweats. Sore throat. Muscle aches. You are unable to take your antibiotic medicine as prescribed. Summary Chlamydia is an infection that spreads through sexual contact. This condition is treated with antibiotics. If it is not treated, it can cause health problems. Your sex partners will also need to be treated. Do not have sex until both you and your partner have been treated. Take all medicines as told and keep all follow-up visits. This information is not intended to replace advice given to you  by your health care provider. Make sure you discuss any questions you have with your health care provider. Document Revised: 01/15/2021 Document Reviewed: 01/18/2021 Elsevier Patient Education  2024 [employer].

## 2023-07-05 ENCOUNTER — Encounter: Payer: Self-pay | Admitting: Nurse Practitioner

## 2023-07-05 ENCOUNTER — Other Ambulatory Visit: Payer: Self-pay

## 2023-07-05 DIAGNOSIS — A568 Sexually transmitted chlamydial infection of other sites: Secondary | ICD-10-CM

## 2023-07-05 DIAGNOSIS — R8281 Pyuria: Secondary | ICD-10-CM

## 2023-07-05 DIAGNOSIS — A749 Chlamydial infection, unspecified: Secondary | ICD-10-CM

## 2023-07-05 LAB — URINALYSIS, ROUTINE W REFLEX MICROSCOPIC
Bilirubin, UA: NEGATIVE
Glucose, UA: NEGATIVE
Ketones, UA: NEGATIVE
Leukocytes,UA: NEGATIVE
Nitrite, UA: NEGATIVE
Protein,UA: NEGATIVE
Specific Gravity, UA: 1.02 (ref 1.005–1.030)
Urobilinogen, Ur: 0.2 mg/dL (ref 0.2–1.0)
pH, UA: 7 (ref 5.0–7.5)

## 2023-07-05 LAB — MICROSCOPIC EXAMINATION

## 2023-07-05 NOTE — Progress Notes (Signed)
 Contacted via MyChart  I have sent urine for culture to see if infection present:)

## 2023-07-07 LAB — GC/CHLAMYDIA PROBE AMP
Chlamydia trachomatis, NAA: NEGATIVE
Neisseria Gonorrhoeae by PCR: NEGATIVE

## 2023-07-07 NOTE — Progress Notes (Signed)
 Contacted via MyChart   Good morning Allison Sanchez, your labs returned and chlamydia is negative:)

## 2023-07-07 NOTE — Progress Notes (Signed)
 Contacted via [employer] like urine infection may be present.  Waiting on rest of urine culture to return and then will treat:)

## 2023-07-08 ENCOUNTER — Other Ambulatory Visit: Payer: Self-pay | Admitting: Nurse Practitioner

## 2023-07-08 LAB — URINE CULTURE

## 2023-07-08 MED ORDER — AMOXICILLIN-POT CLAVULANATE 875-125 MG PO TABS: 1.0000 | ORAL_TABLET | Freq: Two times a day (BID) | ORAL | 0 refills | Status: AC

## 2023-07-08 MED ORDER — FLUCONAZOLE 150 MG PO TABS: 150.0000 mg | ORAL_TABLET | Freq: Once | ORAL | 0 refills | Status: AC

## 2023-08-24 ENCOUNTER — Ambulatory Visit
Admission: EM | Admit: 2023-08-24 | Discharge: 2023-08-24 | Disposition: A | Discharge status: Home / Self Care | Payer: Self-pay | Attending: Physician Assistant | Admitting: Physician Assistant

## 2023-08-24 ENCOUNTER — Encounter: Payer: Self-pay | Admitting: Physician Assistant

## 2023-08-24 DIAGNOSIS — R000 Tachycardia, unspecified: Secondary | ICD-10-CM | POA: Insufficient documentation

## 2023-08-24 DIAGNOSIS — R5383 Other fatigue: Secondary | ICD-10-CM | POA: Insufficient documentation

## 2023-08-24 DIAGNOSIS — J039 Acute tonsillitis, unspecified: Secondary | ICD-10-CM | POA: Insufficient documentation

## 2023-08-24 LAB — GROUP A STREP BY PCR: Group A Strep by PCR: NOT DETECTED

## 2023-08-24 LAB — MONONUCLEOSIS SCREEN: Mono Screen: NEGATIVE

## 2023-08-24 MED ORDER — LIDOCAINE VISCOUS HCL 2 % MT SOLN: 15.0000 mL | OROMUCOSAL | 0 refills | Status: DC | PRN

## 2023-08-24 MED ORDER — PREDNISONE 20 MG PO TABS: 40.0000 mg | ORAL_TABLET | Freq: Every day | ORAL | 0 refills | Status: AC

## 2023-08-24 NOTE — Discharge Instructions (Addendum)
-   Negative strep. - Testing for mono.  Will call with positive. - Viral tonsillitis is most likely.  I sent corticosteroids to help with tonsil swelling and lidocaine  for the pain.  He can also take Tylenol  and use Chloraseptic spray, throat lozenges, salt water gargles. - Go to ER if you develop fever, cannot swallow your own/mucous, have neck swelling or weakness.

## 2023-08-24 NOTE — ED Provider Notes (Signed)
 [facility]    CSN: [id number] Arrival date & time: 08/24/23  0816      History   Chief Complaint Chief Complaint  Patient presents with   Sore Throat    HPI Allison Sanchez is a 28 y.o. female presenting for approximately 1 day history of fever, fatigue, sore throat, painful and difficulty swallowing.  Patient reports history of tonsillar abscess and has concerns for this.  Denies cough, congestion, headaches, chest pain, sinus pain, wheezing, shortness of breath, abdominal pain, vomiting or diarrhea.  Any sick contacts.  Has taken OTC meds without relief.  HPI  Past Medical History:  Diagnosis Date   Anxiety    Asthma    BV (bacterial vaginosis)    History of twin pregnancy in prior pregnancy    Premature delivery    Strep throat    UTI (urinary tract infection)     Patient Active Problem List   Diagnosis Date Noted   Urine positive for Chlamydia trachomatis by PCR 07/02/2023   Weight loss, abnormal 04/05/2023   Elevated low density lipoprotein (LDL) cholesterol level 09/02/2021   Anxiety 04/06/2020   Migraine 09/04/2019   Shellfish allergy 09/04/2019   Gastroesophageal reflux disease with esophagitis 02/26/2014    Past Surgical History:  Procedure Laterality Date   CESAREAN SECTION N/A 01/10/2016   Procedure: CESAREAN SECTION;  Surgeon: Verlyn Goad, MD;  Location: [facility];  Service: Obstetrics;  Laterality: N/A;    OB History     Gravida  1   Para  1   Term      Preterm  1   AB      Living  2      SAB      IAB      Ectopic      Multiple  1   Live Births  2            Home Medications    Prior to Admission medications   Medication Sig Start Date End Date Taking? Authorizing Provider  lidocaine  (XYLOCAINE ) 2 % solution Use as directed 15 mLs in the mouth or throat every 3 (three) hours as needed for mouth pain (swish and spit). 08/24/23  Yes Nancy Axon B, PA-C  norgestimate -ethinyl estradiol  (ESTARYLLA)  0.25-35 MG-MCG tablet Take 1 tablet by mouth daily. 12/09/22  Yes Cannady, Jolene T, NP  predniSONE  (DELTASONE ) 20 MG tablet Take 2 tablets (40 mg total) by mouth daily for 5 days. 08/24/23 08/29/23 Yes Floydene Hy, PA-C  albuterol  (PROVENTIL  HFA;VENTOLIN  HFA) 108 (90 Base) MCG/ACT inhaler Inhale 2 puffs into the lungs every 6 (six) hours as needed. 05/08/17   Cindee Crazier, NP  EPINEPHrine  0.3 mg/0.3 mL IJ SOAJ injection Inject 0.3 mg into the muscle as needed for anaphylaxis.  09/26/17   [provider]  escitalopram  (LEXAPRO ) 10 MG tablet Take 1 tablet (10 mg total) by mouth daily. 05/17/23   Cannady, Jolene T, NP  metroNIDAZOLE  (METROGEL ) 0.75 % vaginal gel Place 1 Applicatorful vaginally at bedtime. 05/17/23   Cannady, Jolene T, NP    Family History Family History  Problem Relation Age of Onset   Hypertension Mother    Sickle cell trait Mother    Diabetes Mother    Cancer Paternal Uncle    Cancer Paternal Uncle    Cancer Paternal Uncle    Gout Maternal Grandfather    Hypertension Daughter        pulmonary   Bladder Cancer Neg Hx  Kidney cancer Neg Hx     Social History Social History   Tobacco Use   Smoking status: Former    Current packs/day: 0.00    Average packs/day: 0.2 packs/day for 3.0 years (0.6 ttl pk-yrs)    Types: Cigarettes    Start date: 2017    Quit date: 2020    Years since quitting: 5.3   Smokeless tobacco: Never  Vaping Use   Vaping status: Every Day  Substance Use Topics   Alcohol use: Yes    Alcohol/week: 1.0 standard drink of alcohol    Types: 1 Standard drinks or equivalent per week   Drug use: Not Currently    Comment: on special occassions     Allergies   Shellfish allergy   Review of Systems Review of Systems  Constitutional:  Positive for fatigue and fever. Negative for chills and diaphoresis.  HENT:  Positive for sore throat. Negative for congestion, ear pain, rhinorrhea and sinus pain.   Respiratory:  Negative for cough and  shortness of breath.   Cardiovascular:  Negative for chest pain.  Gastrointestinal:  Negative for abdominal pain, nausea and vomiting.  Musculoskeletal:  Negative for myalgias.  Skin:  Negative for rash.  Neurological:  Negative for weakness and headaches.  Hematological:  Negative for adenopathy.     Physical Exam Triage Vital Signs ED Triage Vitals  Encounter Vitals Group     BP      Systolic BP Percentile      Diastolic BP Percentile      Pulse      Resp      Temp      Temp src      SpO2      Weight      Height      Head Circumference      Peak Flow      Pain Score      Pain Loc      Pain Education      Exclude from Growth Chart    No data found.  Updated Vital Signs BP 108/68 (BP Location: Right Arm)   Pulse (!) 123   Temp 98.9 F (37.2 C) (Oral)   Resp 15   SpO2 97%      Physical Exam Vitals and nursing note reviewed.  Constitutional:      General: She is not in acute distress.    Appearance: Normal appearance. She is well-developed. She is ill-appearing. She is not toxic-appearing.  HENT:     Head: Normocephalic and atraumatic.     Nose: Nose normal.     Mouth/Throat:     Mouth: Mucous membranes are moist.     Pharynx: Oropharynx is clear. Posterior oropharyngeal erythema present.     Tonsils: 2+ on the right. 2+ on the left.  Eyes:     General: No scleral icterus.       Right eye: No discharge.        Left eye: No discharge.     Conjunctiva/sclera: Conjunctivae normal.  Cardiovascular:     Rate and Rhythm: Regular rhythm. Tachycardia present.     Heart sounds: Normal heart sounds.  Pulmonary:     Effort: Pulmonary effort is normal. No respiratory distress.     Breath sounds: Normal breath sounds.  Musculoskeletal:     Cervical back: Neck supple.  Skin:    General: Skin is dry.  Neurological:     General: No focal deficit present.     Mental Status:  She is alert. Mental status is at baseline.     Motor: No weakness.     Gait: Gait normal.   Psychiatric:        Mood and Affect: Mood normal.        Behavior: Behavior normal.      UC Treatments / Results  Labs (all labs ordered are listed, but only abnormal results are displayed) Labs Reviewed  GROUP A STREP BY PCR  MONONUCLEOSIS SCREEN    EKG   Radiology No results found.  Procedures Procedures (including critical care time)  Medications Ordered in UC Medications - No data to display  Initial Impression / Assessment and Plan / UC Course  I have reviewed the triage vital signs and the nursing notes.  Pertinent labs & imaging results that were available during my care of the patient were reviewed by me and considered in my medical decision making (see chart for details).   28 year old female presents with fever, fatigue, sore throat and painful swallowing for approximately 1 day.  Pulse elevated at 123 bpm.  No acute distress.  On exam has erythema posterior pharynx with 2+ bilateral tonsillar enlargement and swollen anterior cervical lymph nodes.  Chest clear.  Heart regular rhythm but tachycardic.  PCR strep test obtained. Negative.   Mono negative.   Viral tonsillitis. Supportive care. Sent prednisone  for swelling, viscous lidocaine  for pain. Discussed OTC meds. Reviewed return and ED precautions.   Acute illness with systemic symptoms.   Final Clinical Impressions(s) / UC Diagnoses   Final diagnoses:  Acute tonsillitis, unspecified etiology  Other fatigue  Tachycardia     Discharge Instructions      - Negative strep. - Testing for mono.  Will call with positive. - Viral tonsillitis is most likely.  I sent corticosteroids to help with tonsil swelling and lidocaine  for the pain.  He can also take Tylenol  and use Chloraseptic spray, throat lozenges, salt water gargles. - Go to ER if you develop fever, cannot swallow your own/mucous, have neck swelling or weakness.     ED Prescriptions     Medication Sig Dispense Auth. Provider    predniSONE  (DELTASONE ) 20 MG tablet Take 2 tablets (40 mg total) by mouth daily for 5 days. 10 tablet Nancy Axon B, PA-C   lidocaine  (XYLOCAINE ) 2 % solution Use as directed 15 mLs in the mouth or throat every 3 (three) hours as needed for mouth pain (swish and spit). 100 mL Floydene Hy, PA-C      PDMP not reviewed this encounter.   Floydene Hy, PA-C 08/24/23 1001

## 2023-08-24 NOTE — ED Triage Notes (Signed)
 Sx x 1 day  Sore throat  Feverish  fatigue

## 2023-09-25 ENCOUNTER — Other Ambulatory Visit: Payer: Self-pay | Admitting: Nurse Practitioner

## 2023-09-25 ENCOUNTER — Encounter: Payer: Self-pay | Admitting: Nurse Practitioner

## 2023-09-25 MED ORDER — NORGESTIMATE-ETH ESTRADIOL 0.25-35 MG-MCG PO TABS: 1.0000 | ORAL_TABLET | Freq: Every day | ORAL | 6 refills | Status: AC

## 2023-09-26 NOTE — Telephone Encounter (Signed)
 Refilled 09/25/23 # 28 with 6 refills.

## 2023-10-25 ENCOUNTER — Ambulatory Visit: Payer: Self-pay

## 2023-10-25 NOTE — Telephone Encounter (Signed)
 FYI Only or Action Required?: FYI only for provider.  Patient was last seen in primary care on 05/17/2023 by Valerio Melanie DASEN, NP. Called Nurse Triage reporting Dysuria. Symptoms began a week ago. Interventions attempted: Nothing. Symptoms are: stable.  Triage Disposition: See Physician Within 24 Hours  Patient/caregiver understands and will follow disposition?: Yes                             Copied from CRM [id number]. Topic: Clinical - Red Word Triage >> Oct 25, 2023  4:26 PM Powell HERO wrote: Red Word that prompted transfer to Nurse Triage: Pain, burning, when urinating believes she has a UTI. Last week onset. Reason for Disposition  All other patients with painful urination  (Exception: [1] EITHER frequency or urgency AND [2] has on-call doctor.)  Answer Assessment - Initial Assessment Questions 1. SEVERITY: How bad is the pain?  (e.g., Scale 1-10; mild, moderate, or severe)   - MILD (1-3): complains slightly about urination hurting   - MODERATE (4-7): interferes with normal activities     - SEVERE (8-10): excruciating, unwilling or unable to urinate because of the pain      Rates pain 3-4 at this time 3. PATTERN: Is pain present every time you urinate or just sometimes?      Every time she uses the restroom  4. ONSET: When did the painful urination start?      Last week 5. FEVER: Do you have a fever? If Yes, ask: What is your temperature, how was it measured, and when did it start?     Denies 6. PAST UTI: Have you had a urine infection before? If Yes, ask: When was the last time? and What happened that time?      Yes, had a UTI earlier this year  7. CAUSE: What do you think is causing the painful urination?  (e.g., UTI, scratch, Herpes sore)     UTI 8. OTHER SYMPTOMS: Do you have any other symptoms? (e.g., blood in urine, flank pain, genital sores, urgency, vaginal discharge)     Frequency, foul odor of urine, denies flank pain, denies  abdominal pain, denies hematuria  Protocols used: Urination Pain - Female-A-AH

## 2023-10-26 ENCOUNTER — Ambulatory Visit: Payer: Self-pay | Admitting: Nurse Practitioner

## 2023-10-26 ENCOUNTER — Encounter: Payer: Self-pay | Admitting: Nurse Practitioner

## 2023-10-26 ENCOUNTER — Ambulatory Visit (INDEPENDENT_AMBULATORY_CARE_PROVIDER_SITE_OTHER): Payer: Self-pay | Admitting: Nurse Practitioner

## 2023-10-26 VITALS — BP 107/68 | HR 65 | Ht 61.0 in | Wt 129.0 lb

## 2023-10-26 DIAGNOSIS — N3000 Acute cystitis without hematuria: Secondary | ICD-10-CM

## 2023-10-26 DIAGNOSIS — R300 Dysuria: Secondary | ICD-10-CM

## 2023-10-26 LAB — URINALYSIS, ROUTINE W REFLEX MICROSCOPIC
Bilirubin, UA: NEGATIVE
Glucose, UA: NEGATIVE
Ketones, UA: NEGATIVE
Nitrite, UA: POSITIVE — AB
Protein,UA: NEGATIVE
Specific Gravity, UA: 1.015 (ref 1.005–1.030)
Urobilinogen, Ur: 0.2 mg/dL (ref 0.2–1.0)
pH, UA: 6.5 (ref 5.0–7.5)

## 2023-10-26 LAB — MICROSCOPIC EXAMINATION

## 2023-10-26 MED ORDER — NITROFURANTOIN MONOHYD MACRO 100 MG PO CAPS
100.0000 mg | ORAL_CAPSULE | Freq: Two times a day (BID) | ORAL | 0 refills | Status: DC
Start: 2023-10-26 — End: 2024-03-01

## 2023-10-26 MED ORDER — FLUCONAZOLE 150 MG PO TABS: 150.0000 mg | ORAL_TABLET | Freq: Once | ORAL | 0 refills | Status: AC

## 2023-10-26 NOTE — Progress Notes (Signed)
 BP 107/68   Pulse 65   Ht 5' 1 (1.549 m)   Wt 129 lb (58.5 kg)   LMP 10/20/2023   SpO2 97%   BMI 24.37 kg/m    Subjective:    Patient ID: Allison Sanchez, female    DOB: [birth year], 28 y.o.   MRN: [id number]  HPI: Allison Sanchez is a 28 y.o. female  Chief Complaint  Patient presents with   Urinary Tract Infection    Burning sensation, frequency urination-1 week.   URINARY SYMPTOMS Started about a week ago Dysuria: yes Urinary frequency: yes Urgency: no Small volume voids: no Symptom severity: no Urinary incontinence: no Foul odor: yes Hematuria: no Abdominal pain: no Back pain: no Suprapubic pain/pressure: no Flank pain: no Fever: no Vomiting: no Relief with cranberry juice: no Relief with pyridium : no Status: stable Previous urinary tract infection: no Recurrent urinary tract infection: no Treatments attempted: increasing fluids   Relevant past medical, surgical, family and social history reviewed and updated as indicated. Interim medical history since our last visit reviewed. Allergies and medications reviewed and updated.  Review of Systems  Constitutional:  Negative for fever.  Gastrointestinal:  Negative for abdominal pain and vomiting.  Genitourinary:  Positive for dysuria and frequency. Negative for decreased urine volume, flank pain, hematuria and urgency.       Foul odor  Musculoskeletal:  Negative for back pain.    Per HPI unless specifically indicated above     Objective:    BP 107/68   Pulse 65   Ht 5' 1 (1.549 m)   Wt 129 lb (58.5 kg)   LMP 10/20/2023   SpO2 97%   BMI 24.37 kg/m   Wt Readings from Last 3 Encounters:  10/26/23 129 lb (58.5 kg)  05/17/23 118 lb 12.8 oz (53.9 kg)  04/05/23 119 lb 9.6 oz (54.3 kg)    Physical Exam Vitals and nursing note reviewed.  Constitutional:      General: She is not in acute distress.    Appearance: Normal appearance. She is normal weight. She is not ill-appearing, toxic-appearing or  diaphoretic.  HENT:     Head: Normocephalic.     Right Ear: External ear normal.     Left Ear: External ear normal.     Nose: Nose normal.     Mouth/Throat:     Mouth: Mucous membranes are moist.     Pharynx: Oropharynx is clear.  Eyes:     General:        Right eye: No discharge.        Left eye: No discharge.     Extraocular Movements: Extraocular movements intact.     Conjunctiva/sclera: Conjunctivae normal.     Pupils: Pupils are equal, round, and reactive to light.  Cardiovascular:     Rate and Rhythm: Normal rate and regular rhythm.     Heart sounds: No murmur heard. Pulmonary:     Effort: Pulmonary effort is normal. No respiratory distress.     Breath sounds: Normal breath sounds. No wheezing or rales.  Abdominal:     General: Abdomen is flat. Bowel sounds are normal. There is no distension.     Palpations: Abdomen is soft. There is no mass.     Tenderness: There is no abdominal tenderness. There is no right CVA tenderness, left CVA tenderness, guarding or rebound.     Hernia: No hernia is present.  Musculoskeletal:     Cervical back: Normal range of motion and neck  supple.  Skin:    General: Skin is warm and dry.     Capillary Refill: Capillary refill takes less than 2 seconds.  Neurological:     General: No focal deficit present.     Mental Status: She is alert and oriented to person, place, and time. Mental status is at baseline.  Psychiatric:        Mood and Affect: Mood normal.        Behavior: Behavior normal.        Thought Content: Thought content normal.        Judgment: Judgment normal.     Results for orders placed or performed during the hospital encounter of 08/24/23  Group A Strep by PCR   Collection Time: 08/24/23  8:24 AM   Specimen: Throat; Sterile Swab  Result Value Ref Range   Group A Strep by PCR NOT DETECTED NOT DETECTED  Mononucleosis screen   Collection Time: 08/24/23  9:13 AM  Result Value Ref Range   Mono Screen NEGATIVE NEGATIVE       Assessment & Plan:   Problem List Items Addressed This Visit   None Visit Diagnoses       Acute cystitis without hematuria    -  Primary   Will treat with Macrobid .  Increase fluid intake. Will also send Diflucan  to treat yeast infection.  Urine sent for culture will change antibiotic if needed.   Relevant Orders   Urine Culture     Dysuria       Relevant Orders   Urinalysis, Routine w reflex microscopic        Follow up plan: No follow-ups on file.

## 2023-10-30 LAB — URINE CULTURE

## 2023-10-31 MED ORDER — SULFAMETHOXAZOLE-TRIMETHOPRIM 800-160 MG PO TABS: 1.0000 | ORAL_TABLET | Freq: Two times a day (BID) | ORAL | 0 refills | Status: DC

## 2023-12-01 ENCOUNTER — Ambulatory Visit: Payer: Self-pay | Admitting: Pediatrics

## 2023-12-20 ENCOUNTER — Ambulatory Visit: Payer: Self-pay | Admitting: Pediatrics

## 2023-12-29 ENCOUNTER — Ambulatory Visit (INDEPENDENT_AMBULATORY_CARE_PROVIDER_SITE_OTHER): Payer: Self-pay | Admitting: Pediatrics

## 2023-12-29 ENCOUNTER — Ambulatory Visit: Payer: Self-pay | Admitting: Pediatrics

## 2023-12-29 ENCOUNTER — Encounter: Payer: Self-pay | Admitting: Pediatrics

## 2023-12-29 VITALS — BP 104/70 | HR 65 | Temp 98.6°F | Wt 132.4 lb

## 2023-12-29 DIAGNOSIS — N949 Unspecified condition associated with female genital organs and menstrual cycle: Secondary | ICD-10-CM

## 2023-12-29 DIAGNOSIS — R399 Unspecified symptoms and signs involving the genitourinary system: Secondary | ICD-10-CM

## 2023-12-29 DIAGNOSIS — Z202 Contact with and (suspected) exposure to infections with a predominantly sexual mode of transmission: Secondary | ICD-10-CM

## 2023-12-29 LAB — URINALYSIS, ROUTINE W REFLEX MICROSCOPIC
Bilirubin, UA: NEGATIVE
Glucose, UA: NEGATIVE
Ketones, UA: NEGATIVE
Nitrite, UA: NEGATIVE
Protein,UA: NEGATIVE
RBC, UA: NEGATIVE
Specific Gravity, UA: 1.015 (ref 1.005–1.030)
Urobilinogen, Ur: 0.2 mg/dL (ref 0.2–1.0)
pH, UA: 7 (ref 5.0–7.5)

## 2023-12-29 LAB — MICROSCOPIC EXAMINATION
Bacteria, UA: NONE SEEN
RBC, Urine: NONE SEEN /HPF (ref 0–2)

## 2023-12-29 LAB — WET PREP FOR TRICH, YEAST, CLUE
Clue Cell Exam: NEGATIVE
Trichomonas Exam: NEGATIVE
Yeast Exam: NEGATIVE

## 2023-12-29 NOTE — Patient Instructions (Signed)
Will let you know results

## 2023-12-29 NOTE — Progress Notes (Signed)
 Office Visit  BP 104/70   Pulse 65   Temp 98.6 F (37 C) (Oral)   Wt 132 lb 6.4 oz (60.1 kg)   LMP 12/15/2023 (Approximate)   SpO2 99%   BMI 25.02 kg/m    Subjective:    Patient ID: Sarah JONELLE Favor, female    DOB: 23-May-1995, 28 y.o.   MRN: [id number]  HPI: Allison Sanchez is a 28 y.o. female  Chief Complaint  Patient presents with   Exposure to STD   Dysuria    Burning with urination on-going for about week    Discussed the use of AI scribe software for clinical note transcription with the patient, who gave verbal consent to proceed.  History of Present Illness   DISNEY RUGGIERO is a 28 year old female who presents with burning during urination and concerns about potential STD exposure.  She experiences burning during urination, which she suspects might be a urinary tract infection (UTI) due to her history of frequent UTIs, including one a couple of months ago.  She has a history of chlamydia diagnosed in December, for which she was treated with doxycycline . She discovered her boyfriend had a prescription for the same medication around June and again in early August, raising concerns about potential STD exposure. She was not sexually active during the first discovery but had been intimate the night before the second discovery.  She has been in a long-term relationship with her boyfriend for nearly ten years, and she has children together. She is not frequently sexually active, approximately once every six months.      Relevant past medical, surgical, family and social history reviewed and updated as indicated. Interim medical history since our last visit reviewed. Allergies and medications reviewed and updated.  ROS per HPI unless specifically indicated above     Objective:    BP 104/70   Pulse 65   Temp 98.6 F (37 C) (Oral)   Wt 132 lb 6.4 oz (60.1 kg)   LMP 12/15/2023 (Approximate)   SpO2 99%   BMI 25.02 kg/m   Wt Readings from Last 3 Encounters:   12/29/23 132 lb 6.4 oz (60.1 kg)  10/26/23 129 lb (58.5 kg)  05/17/23 118 lb 12.8 oz (53.9 kg)     Physical Exam Constitutional:      Appearance: Normal appearance.  Pulmonary:     Effort: Pulmonary effort is normal.  Musculoskeletal:        General: Normal range of motion.  Skin:    Comments: Normal skin color  Neurological:     General: No focal deficit present.     Mental Status: She is alert. Mental status is at baseline.  Psychiatric:        Mood and Affect: Mood normal.        Behavior: Behavior normal.        Thought Content: Thought content normal.         12/29/2023   10:23 AM 10/26/2023    9:24 AM 05/17/2023   11:57 AM 04/05/2023    2:56 PM 09/02/2021    9:16 AM  Depression screen PHQ 2/9  Decreased Interest 1 1 1 1 3   Down, Depressed, Hopeless 1 0 1 1 2   PHQ - 2 Score 2 1 2 2 5   Altered sleeping 0 0 2 0 2  Tired, decreased energy 2 1 2 2 3   Change in appetite 0 0 2 0 1  Feeling bad or failure about yourself  0 1 1 1 3   Trouble concentrating 0 0 1 1 1   Moving slowly or fidgety/restless 0 0 1 0 0  Suicidal thoughts 0 0 0 0 0  PHQ-9 Score 4 3 11 6 15   Difficult doing work/chores Somewhat difficult Not difficult at all Somewhat difficult Somewhat difficult        12/29/2023   10:23 AM 10/26/2023    9:25 AM 05/17/2023   11:57 AM 04/05/2023    2:57 PM  GAD 7 : Generalized Anxiety Score  Nervous, Anxious, on Edge 0 0 1 1  Control/stop worrying 0 0 1 2  Worry too much - different things 2 1 2 3   Trouble relaxing 0 0 0 0  Restless 0 0 0 0  Easily annoyed or irritable 2 2 2 2   Afraid - awful might happen 1 0 1 1  Total GAD 7 Score 5 3 7 9   Anxiety Difficulty Not difficult at all Not difficult at all Somewhat difficult Somewhat difficult       Assessment & Plan:  Assessment & Plan    Genitourinary symptoms with suspected sexually transmitted infection exposure and history of recurrent urinary tract infections Burning during urination suggests UTI or STI.  Recurrent UTIs and recent potential STI exposure noted. Differential includes UTI and STI, BV/yeast Awaiting culture results for antibiotic guidance.  - Order full STI panel including HIV and syphilis testing. - Perform urine culture and swab. - Await culture results before prescribing antibiotics. - Advise to monitor symptoms and report if she worsens. - Communicate results via MyChart or phone call.   Urinary tract infection symptoms -     Urinalysis, Routine w reflex microscopic -     Urine Culture  STD exposure -     HIV Antibody (routine testing w rflx) -     Chlamydia/Gonococcus/Trichomonas, NAA -     RPR w/reflex to TrepSure  Vaginal discomfort -     WET PREP FOR TRICH, YEAST, CLUE    Follow up plan: Return if symptoms worsen or fail to improve.  Hadassah SHAUNNA Nett, MD

## 2023-12-30 LAB — RPR W/REFLEX TO TREPSURE

## 2024-01-01 LAB — CHLAMYDIA/GONOCOCCUS/TRICHOMONAS, NAA
Chlamydia by NAA: NEGATIVE
Gonococcus by NAA: NEGATIVE
Trich vag by NAA: NEGATIVE

## 2024-01-01 LAB — TREPONEMAL ANTIBODIES, TPPA: Treponemal Antibodies, TPPA: NONREACTIVE

## 2024-01-01 LAB — HIV ANTIBODY (ROUTINE TESTING W REFLEX): HIV Screen 4th Generation wRfx: NONREACTIVE

## 2024-01-01 LAB — RPR W/REFLEX TO TREPSURE: RPR: NONREACTIVE

## 2024-01-03 LAB — URINE CULTURE

## 2024-01-04 ENCOUNTER — Other Ambulatory Visit: Payer: Self-pay | Admitting: Pediatrics

## 2024-01-04 DIAGNOSIS — N3000 Acute cystitis without hematuria: Secondary | ICD-10-CM

## 2024-01-04 DIAGNOSIS — B3731 Acute candidiasis of vulva and vagina: Secondary | ICD-10-CM

## 2024-01-04 MED ORDER — FLUCONAZOLE 150 MG PO TABS: 150.0000 mg | ORAL_TABLET | Freq: Once | ORAL | 0 refills | Status: AC

## 2024-01-04 MED ORDER — NITROFURANTOIN MONOHYD MACRO 100 MG PO CAPS: 100.0000 mg | ORAL_CAPSULE | Freq: Two times a day (BID) | ORAL | 0 refills | Status: AC

## 2024-01-04 NOTE — Progress Notes (Signed)
 Macrobid  and diflucan  sent for UTI and post abx yeast infxn  Allison SHAUNNA Nett, MD

## 2024-03-01 ENCOUNTER — Encounter: Payer: Self-pay | Admitting: Emergency Medicine

## 2024-03-01 ENCOUNTER — Ambulatory Visit
Admission: EM | Admit: 2024-03-01 | Discharge: 2024-03-01 | Disposition: A | Discharge status: Home / Self Care | Payer: Self-pay | Attending: Physician Assistant | Admitting: Physician Assistant

## 2024-03-01 DIAGNOSIS — J029 Acute pharyngitis, unspecified: Secondary | ICD-10-CM | POA: Insufficient documentation

## 2024-03-01 DIAGNOSIS — J039 Acute tonsillitis, unspecified: Secondary | ICD-10-CM | POA: Insufficient documentation

## 2024-03-01 LAB — POCT MONO SCREEN (KUC): Mono, POC: NEGATIVE

## 2024-03-01 LAB — POCT RAPID STREP A (OFFICE): Rapid Strep A Screen: NEGATIVE

## 2024-03-01 MED ORDER — PREDNISONE 20 MG PO TABS: 40.0000 mg | ORAL_TABLET | Freq: Every day | ORAL | 0 refills | Status: AC

## 2024-03-01 MED ORDER — LIDOCAINE VISCOUS HCL 2 % MT SOLN: 15.0000 mL | OROMUCOSAL | 0 refills | Status: DC | PRN

## 2024-03-01 NOTE — Discharge Instructions (Signed)
-   Negative strep and mono. Strep culture pending. Will call in a couple day if positive and you need antibiotics. - Viral tonsillitis is most likely.  I sent corticosteroids to help with tonsil swelling and lidocaine  for the pain.  You can also take Tylenol  and use Chloraseptic spray, throat lozenges, salt water gargles. - Go to ER if you develop fever, cannot swallow your own/mucous, have neck swelling or weakness.

## 2024-03-01 NOTE — ED Triage Notes (Signed)
 Patient c/o sore throat and fatigue for the past 2 weeks.  Patient denies any other symptoms.  Patient denies fevers.

## 2024-03-01 NOTE — ED Provider Notes (Addendum)
 [facility]    CSN: [id number] Arrival date & time: 03/01/24  9178      History   Chief Complaint Chief Complaint  Patient presents with   Sore Throat    HPI Allison Sanchez is a 28 y.o. female presenting for approximately 2 week history of fatigue, sore throat, painful and difficulty swallowing. Denies fever, cough, congestion, headaches, chest pain, sinus pain, wheezing, shortness of breath, abdominal pain, vomiting or diarrhea.  Denies any sick contacts.  Has taken OTC meds without relief. History of tonsillitis that responded to prednisone  a few months ago.  HPI  Past Medical History:  Diagnosis Date   Anxiety    Asthma    BV (bacterial vaginosis)    History of twin pregnancy in prior pregnancy    Premature delivery    Strep throat    UTI (urinary tract infection)     Patient Active Problem List   Diagnosis Date Noted   Urine positive for Chlamydia trachomatis by PCR 07/02/2023   Weight loss, abnormal 04/05/2023   Elevated low density lipoprotein (LDL) cholesterol level 09/02/2021   Anxiety 04/06/2020   Migraine 09/04/2019   Shellfish allergy 09/04/2019   Gastroesophageal reflux disease with esophagitis 02/26/2014    Past Surgical History:  Procedure Laterality Date   CESAREAN SECTION N/A 01/10/2016   Procedure: CESAREAN SECTION;  Surgeon: Winton Felt, MD;  Location: [facility];  Service: Obstetrics;  Laterality: N/A;    OB History     Gravida  1   Para  1   Term      Preterm  1   AB      Living  2      SAB      IAB      Ectopic      Multiple  1   Live Births  2            Home Medications    Prior to Admission medications   Medication Sig Start Date End Date Taking? Authorizing Provider  lidocaine  (XYLOCAINE ) 2 % solution Use as directed 15 mLs in the mouth or throat every 3 (three) hours as needed for mouth pain (swish and spit). 03/01/24  Yes Arvis Huxley B, PA-C  predniSONE  (DELTASONE ) 20 MG tablet  Take 2 tablets (40 mg total) by mouth daily for 5 days. 03/01/24 03/06/24 Yes Arvis Huxley NOVAK, PA-C  albuterol  (PROVENTIL  HFA;VENTOLIN  HFA) 108 (90 Base) MCG/ACT inhaler Inhale 2 puffs into the lungs every 6 (six) hours as needed. Patient not taking: Reported on 12/29/2023 05/08/17   Daphane Rosella, NP  EPINEPHrine  0.3 mg/0.3 mL IJ SOAJ injection Inject 0.3 mg into the muscle as needed for anaphylaxis.  Patient not taking: Reported on 12/29/2023 09/26/17   [provider]  escitalopram  (LEXAPRO ) 10 MG tablet Take 1 tablet (10 mg total) by mouth daily. Patient not taking: Reported on 12/29/2023 05/17/23   Cannady, Jolene T, NP  norgestimate -ethinyl estradiol  (ESTARYLLA) 0.25-35 MG-MCG tablet Take 1 tablet by mouth daily. 09/25/23   Cannady, Jolene T, NP    Family History Family History  Problem Relation Age of Onset   Hypertension Mother    Sickle cell trait Mother    Diabetes Mother    Cancer Paternal Uncle    Cancer Paternal Uncle    Cancer Paternal Uncle    Gout Maternal Grandfather    Hypertension Daughter        pulmonary   Bladder Cancer Neg Hx    Kidney  cancer Neg Hx     Social History Social History   Tobacco Use   Smoking status: Former    Current packs/day: 0.00    Average packs/day: 0.2 packs/day for 3.0 years (0.6 ttl pk-yrs)    Types: Cigarettes    Start date: 2017    Quit date: 2020    Years since quitting: 5.8   Smokeless tobacco: Never  Vaping Use   Vaping status: Every Day  Substance Use Topics   Alcohol use: Yes    Alcohol/week: 1.0 standard drink of alcohol    Types: 1 Standard drinks or equivalent per week   Drug use: Not Currently    Comment: on special occassions     Allergies   Shellfish allergy   Review of Systems Review of Systems  Constitutional:  Positive for fatigue. Negative for chills, diaphoresis and fever.  HENT:  Positive for sore throat. Negative for congestion, ear pain, rhinorrhea and sinus pain.   Respiratory:  Negative for  cough and shortness of breath.   Cardiovascular:  Negative for chest pain.  Gastrointestinal:  Negative for abdominal pain, nausea and vomiting.  Musculoskeletal:  Negative for myalgias.  Skin:  Negative for rash.  Neurological:  Negative for weakness and headaches.  Hematological:  Positive for adenopathy.     Physical Exam Triage Vital Signs ED Triage Vitals  Encounter Vitals Group     BP      Systolic BP Percentile      Diastolic BP Percentile      Pulse      Resp      Temp      Temp src      SpO2      Weight      Height      Head Circumference      Peak Flow      Pain Score      Pain Loc      Pain Education      Exclude from Growth Chart    No data found.  Updated Vital Signs BP 123/85 (BP Location: Left Arm)   Pulse 74   Temp 98.8 F (37.1 C) (Oral)   Resp 14   Ht 5' 2 (1.575 m)   Wt 132 lb (59.9 kg)   LMP 02/14/2024 (Approximate)   SpO2 97%   BMI 24.14 kg/m      Physical Exam Vitals and nursing note reviewed.  Constitutional:      General: She is not in acute distress.    Appearance: Normal appearance. She is well-developed. She is not ill-appearing or toxic-appearing.  HENT:     Head: Normocephalic and atraumatic.     Right Ear: Tympanic membrane, ear canal and external ear normal.     Left Ear: Tympanic membrane, ear canal and external ear normal.     Nose: Congestion present.     Mouth/Throat:     Mouth: Mucous membranes are moist.     Pharynx: Oropharynx is clear. Posterior oropharyngeal erythema present.     Tonsils: 2+ on the right. 2+ on the left.  Eyes:     General: No scleral icterus.       Right eye: No discharge.        Left eye: No discharge.     Conjunctiva/sclera: Conjunctivae normal.  Cardiovascular:     Rate and Rhythm: Normal rate and regular rhythm.     Heart sounds: Normal heart sounds.  Pulmonary:     Effort: Pulmonary effort is  normal. No respiratory distress.     Breath sounds: Normal breath sounds.  Musculoskeletal:      Cervical back: Neck supple.  Skin:    General: Skin is dry.  Neurological:     General: No focal deficit present.     Mental Status: She is alert. Mental status is at baseline.     Motor: No weakness.     Gait: Gait normal.  Psychiatric:        Mood and Affect: Mood normal.        Behavior: Behavior normal.      UC Treatments / Results  Labs (all labs ordered are listed, but only abnormal results are displayed) Labs Reviewed  POCT RAPID STREP A (OFFICE) - Normal  POCT MONO SCREEN ([facility]) - Normal  CULTURE, GROUP A STREP [facility])    EKG   Radiology No results found.  Procedures Procedures (including critical care time)  Medications Ordered in UC Medications - No data to display  Initial Impression / Assessment and Plan / UC Course  I have reviewed the triage vital signs and the nursing notes.  Pertinent labs & imaging results that were available during my care of the patient were reviewed by me and considered in my medical decision making (see chart for details).   28 year old female presents with fatigue, sore throat and painful swallowing for approximately x 2 weeks.  Patient is overall well appearing. No acute distress.  On exam has erythema posterior pharynx with 2+ bilateral tonsillar enlargement and swollen anterior cervical lymph nodes. Mild nasal congestion.  Chest clear.  Heart regular rhythm.  Strep test obtained. Negative. Strep culture sent. Will treat with antibiotics if positive.   Mono negative.   Viral tonsillitis. Supportive care. Sent prednisone  for swelling, viscous lidocaine  for pain. Discussed OTC meds. Reviewed return and ED precautions.    Final Clinical Impressions(s) / UC Diagnoses   Final diagnoses:  Sore throat  Acute tonsillitis, unspecified etiology     Discharge Instructions      - Negative strep and mono. Strep culture pending. Will call in a couple day if positive and you need antibiotics. - Viral tonsillitis is most  likely.  I sent corticosteroids to help with tonsil swelling and lidocaine  for the pain.  You can also take Tylenol  and use Chloraseptic spray, throat lozenges, salt water gargles. - Go to ER if you develop fever, cannot swallow your own/mucous, have neck swelling or weakness.        ED Prescriptions     Medication Sig Dispense Auth. Provider   predniSONE  (DELTASONE ) 20 MG tablet Take 2 tablets (40 mg total) by mouth daily for 5 days. 10 tablet Arvis Huxley B, PA-C   lidocaine  (XYLOCAINE ) 2 % solution Use as directed 15 mLs in the mouth or throat every 3 (three) hours as needed for mouth pain (swish and spit). 100 mL Arvis Huxley NOVAK, PA-C      PDMP not reviewed this encounter.       Arvis Huxley NOVAK, PA-C 03/01/24 [id number]

## 2024-03-03 ENCOUNTER — Telehealth: Payer: Self-pay | Admitting: Physician Assistant

## 2024-03-03 LAB — CULTURE, GROUP A STREP (THRC)

## 2024-03-03 MED ORDER — AMOXICILLIN 500 MG PO CAPS
500.0000 mg | ORAL_CAPSULE | Freq: Two times a day (BID) | ORAL | 0 refills | Status: AC
Start: 2024-03-03 — End: 2024-03-13

## 2024-03-03 NOTE — Telephone Encounter (Signed)
 Positive strep culture.  Not group A strep but patient has been symptomatic for 2 weeks.  I called to discuss results with her.  Will treat her with amoxicillin .  Sent to pharmacy.

## 2024-03-04 ENCOUNTER — Ambulatory Visit (HOSPITAL_COMMUNITY): Payer: Self-pay

## 2024-05-06 ENCOUNTER — Other Ambulatory Visit: Payer: Self-pay

## 2024-05-06 ENCOUNTER — Ambulatory Visit
Admission: EM | Admit: 2024-05-06 | Discharge: 2024-05-06 | Disposition: A | Discharge status: Home / Self Care | Payer: Self-pay | Attending: Emergency Medicine | Admitting: Emergency Medicine

## 2024-05-06 ENCOUNTER — Encounter: Payer: Self-pay | Admitting: Emergency Medicine

## 2024-05-06 ENCOUNTER — Ambulatory Visit: Payer: Self-pay | Admitting: Emergency Medicine

## 2024-05-06 DIAGNOSIS — Z113 Encounter for screening for infections with a predominantly sexual mode of transmission: Secondary | ICD-10-CM | POA: Insufficient documentation

## 2024-05-06 DIAGNOSIS — R350 Frequency of micturition: Secondary | ICD-10-CM | POA: Insufficient documentation

## 2024-05-06 DIAGNOSIS — R319 Hematuria, unspecified: Secondary | ICD-10-CM | POA: Insufficient documentation

## 2024-05-06 DIAGNOSIS — N390 Urinary tract infection, site not specified: Secondary | ICD-10-CM | POA: Insufficient documentation

## 2024-05-06 LAB — POCT URINE DIPSTICK
Bilirubin, UA: NEGATIVE
Glucose, UA: NEGATIVE mg/dL
Ketones, POC UA: NEGATIVE mg/dL
Nitrite, UA: POSITIVE — AB
Protein Ur, POC: 100 mg/dL — AB
Spec Grav, UA: >=1.03 — AB
Urobilinogen, UA: 0.2 U/dL
pH, UA: 6

## 2024-05-06 LAB — POCT URINE PREGNANCY: Preg Test, Ur: NEGATIVE

## 2024-05-06 LAB — HIV ANTIBODY (ROUTINE TESTING W REFLEX): HIV Screen 4th Generation wRfx: NONREACTIVE

## 2024-05-06 LAB — SYPHILIS: RPR W/REFLEX TO RPR TITER AND TREPONEMAL ANTIBODIES, TRADITIONAL SCREENING AND DIAGNOSIS ALGORITHM: RPR Ser Ql: NONREACTIVE

## 2024-05-06 MED ORDER — NITROFURANTOIN MONOHYD MACRO 100 MG PO CAPS: 100.0000 mg | ORAL_CAPSULE | Freq: Two times a day (BID) | ORAL | 0 refills | Status: AC

## 2024-05-06 MED ORDER — PHENAZOPYRIDINE HCL 200 MG PO TABS: 200.0000 mg | ORAL_TABLET | Freq: Three times a day (TID) | ORAL | 0 refills | Status: AC | PRN

## 2024-05-06 NOTE — Discharge Instructions (Signed)
 We will contact you if your urinalysis comes back positive for urinary tract infection or pregnancy and I will call in the appropriate antibiotics.  We have sent off a swab for gonorrhea, chlamydia, trichomonas, HIV and syphilis. Take the medication as written. Give us  a working phone number so that we can contact you if needed. Refrain from sexual contact until all of your labs have come back, symptoms have resolved, and your partner(s) are treated if necessary. Return to the ER if you get worse, have a fever >100.4, or for any concerns.   Go to www.goodrx.com  or www.costplusdrugs.com to look up your medications. This will give you a list of where you can find your prescriptions at the most affordable prices. Or ask the pharmacist what the cash price is, or if they have any other discount programs available to help make your medication more affordable. This can be less expensive than what you would pay with insurance.

## 2024-05-06 NOTE — ED Provider Notes (Signed)
 HPI  SUBJECTIVE:  Allison Sanchez is a 29 y.o. female who presents with 2 issues: First, she is concerned about a UTI.  She reports urinary frequency and odorous urine for the past week.  No dysuria, urgency, cloudy urine or hematuria.  No nausea, vomiting, fevers, abdominal, back, pelvic pain.  No vaginal odor, discharge, itch or rash.  She is on menses now.  Second, she states that she was exposed to chlamydia 4 days ago.  She is in a relatively new relationship with a female, who was recently diagnosed with lymphogranuloma venereum, but his labs are still pending.  She does not have any other partners, but is not sure about him.  She has a past medical history of UTI, chlamydia, BV and yeast.  No history of pyelonephritis, nephrolithiasis, gonorrhea, HIV, HSV, syphilis, trichomonas, diabetes.  LMP: Now.  Not sure if she could be pregnant.  PCP: [facility]  Past Medical History:  Diagnosis Date   Anxiety    Asthma    BV (bacterial vaginosis)    History of twin pregnancy in prior pregnancy    Premature delivery    Strep throat    UTI (urinary tract infection)     Past Surgical History:  Procedure Laterality Date   CESAREAN SECTION N/A 01/10/2016   Procedure: CESAREAN SECTION;  Surgeon: Winton Felt, MD;  Location: [facility];  Service: Obstetrics;  Laterality: N/A;    Family History  Problem Relation Age of Onset   Hypertension Mother    Sickle cell trait Mother    Diabetes Mother    Cancer Paternal Uncle    Cancer Paternal Uncle    Cancer Paternal Uncle    Gout Maternal Grandfather    Hypertension Daughter        pulmonary   Bladder Cancer Neg Hx    Kidney cancer Neg Hx     Social History[1]  Current Medications[2]  Allergies[3]   ROS  As noted in HPI.   Physical Exam  BP 120/81 (BP Location: Left Arm)   Pulse 73   Temp 98.2 F (36.8 C) (Oral)   Resp 18   LMP 05/03/2024 (Approximate)   SpO2 100%   Constitutional: Well developed,  well nourished, no acute distress Eyes:  EOMI, conjunctiva normal bilaterally HENT: Normocephalic, atraumatic,mucus membranes moist Respiratory: Normal inspiratory effort Cardiovascular: Normal rate GI: nondistended soft, nontender. No suprapubic, flank, lower quadrant tenderness  back: No CVA tenderness skin: No rash, skin intact Musculoskeletal: no deformities Neurologic: Alert & oriented x 3, no focal neuro deficits Psychiatric: Speech and behavior appropriate   ED Course   Medications - No data to display  Orders Placed This Encounter  Procedures   Urine Culture    Standing Status:   Standing    Number of Occurrences:   1    Indication:   Urgency/frequency   RPR    Standing Status:   Standing    Number of Occurrences:   1   HIV Antibody (routine testing w rflx)    Standing Status:   Standing    Number of Occurrences:   1   POC Urinalysis Dipstick    Standing Status:   Standing    Number of Occurrences:   1   POCT urine pregnancy    Standing Status:   Standing    Number of Occurrences:   1    Results for orders placed or performed during the hospital encounter of 05/06/24 (from the past 24 hours)  POC Urinalysis Dipstick     Status: Abnormal   Collection Time: 05/06/24  9:15 AM  Result Value Ref Range   Color, UA orange (A) yellow   Clarity, UA cloudy (A) clear   Glucose, UA negative negative mg/dL   Bilirubin, UA negative negative   Ketones, POC UA negative negative mg/dL   Spec Grav, UA >=8.969 (A) 1.010 - 1.025   Blood, UA large (A) negative   pH, UA 6.0 5.0 - 8.0   Protein Ur, POC =100 (A) negative mg/dL   Urobilinogen, UA 0.2 0.2 or 1.0 E.U./dL   Nitrite, UA Positive (A) Negative   Leukocytes, UA Trace (A) Negative  POCT urine pregnancy     Status: Normal   Collection Time: 05/06/24  9:30 AM  Result Value Ref Range   Preg Test, Ur Negative Negative   No results found.  ED Clinical Impression  1. Urinary tract infection with hematuria, site  unspecified   2. Urinary frequency   3. Screening for STDs (sexually transmitted diseases)     ED Assessment/Plan    1.  Urinary symptoms.  Will check urinalysis, urine pregnancy.  Will contact patient at [phone] if either 1 of these are positive.  Will send urine off for culture if urine dip positive for UTI.  Will send home with Pyridium  to help with the frequency.  Urine pregnancy negative.  UA suggestive of UTI.  Will send this off for culture to confirm antibiotic choice.  Will prescribe Macrobid  100 mg p.o. twice daily for 5 days.  Sent patient MyChart note forming her positive urine dip and of prescription waiting for her at the pharmacy.  Will also have staff reach out to her and notify her.  2.  STD testing.  Checking gonorrhea, chlamydia, trichomonas, HIV and RPR.  Patient would like to wait for lab results prior to initiating further treatment.  Advised pt to refrain from sexual contact until all lab results are back, symptoms resolve, and partner(s) are treated if necessary. Pt provided working phone number. Follow-up with PCP as needed. Discussed labs, MDM, plan and followup with patient. Pt agrees with plan.   Meds ordered this encounter  Medications   phenazopyridine  (PYRIDIUM ) 200 MG tablet    Sig: Take 1 tablet (200 mg total) by mouth 3 (three) times daily as needed for pain.    Dispense:  6 tablet    Refill:  0   nitrofurantoin , macrocrystal-monohydrate, (MACROBID ) 100 MG capsule    Sig: Take 1 capsule (100 mg total) by mouth 2 (two) times daily for 5 days.    Dispense:  10 capsule    Refill:  0    *This clinic note was created using Scientist, clinical (histocompatibility and immunogenetics). Therefore, there may be occasional mistakes despite careful proofreading.  ?       [1]  Social History Tobacco Use   Smoking status: Former    Current packs/day: 0.00    Average packs/day: 0.2 packs/day for 3.0 years (0.6 ttl pk-yrs)    Types: Cigarettes    Start date: 2017    Quit date: 2020     Years since quitting: 6.0   Smokeless tobacco: Never  Vaping Use   Vaping status: Every Day  Substance Use Topics   Alcohol use: Yes    Alcohol/week: 1.0 standard drink of alcohol    Types: 1 Standard drinks or equivalent per week   Drug use: Not Currently    Comment: on special occassions  [2] No current facility-administered  medications for this encounter.  Current Outpatient Medications:    nitrofurantoin , macrocrystal-monohydrate, (MACROBID ) 100 MG capsule, Take 1 capsule (100 mg total) by mouth 2 (two) times daily for 5 days., Disp: 10 capsule, Rfl: 0   phenazopyridine  (PYRIDIUM ) 200 MG tablet, Take 1 tablet (200 mg total) by mouth 3 (three) times daily as needed for pain., Disp: 6 tablet, Rfl: 0   escitalopram  (LEXAPRO ) 10 MG tablet, Take 1 tablet (10 mg total) by mouth daily. (Patient not taking: Reported on 12/29/2023), Disp: 90 tablet, Rfl: 4   norgestimate -ethinyl estradiol  (ESTARYLLA) 0.25-35 MG-MCG tablet, Take 1 tablet by mouth daily., Disp: 28 tablet, Rfl: 6 [3]  Allergies Allergen Reactions   Shellfish Allergy Swelling     Van Knee, MD 05/06/24 1204

## 2024-05-06 NOTE — ED Notes (Signed)
 Provided work note to return to work today at [employer]

## 2024-05-06 NOTE — ED Triage Notes (Signed)
 Frequent urination, noticed 1 week ago.  Denies burning with urination.  Denies vaginal discharge.    Patient requesting std and uti testing.

## 2024-05-07 LAB — CERVICOVAGINAL ANCILLARY ONLY
Chlamydia: NEGATIVE
Comment: NEGATIVE
Comment: NEGATIVE
Comment: NORMAL
Neisseria Gonorrhea: NEGATIVE
Trichomonas: NEGATIVE

## 2024-05-08 LAB — URINE CULTURE: Culture: >=100000 — AB
